# Patient Record
Sex: Female | Born: 1980
Health system: Southern US, Community
[De-identification: ages and names within clinical notes are randomized; demographics above are authoritative.]

## PROBLEM LIST (undated history)

## (undated) ENCOUNTER — Inpatient Hospital Stay (HOSPITAL_COMMUNITY): Payer: Self-pay

## (undated) DIAGNOSIS — O1490 Unspecified pre-eclampsia, unspecified trimester: Secondary | ICD-10-CM

## (undated) DIAGNOSIS — T4145XA Adverse effect of unspecified anesthetic, initial encounter: Secondary | ICD-10-CM

## (undated) DIAGNOSIS — T8859XA Other complications of anesthesia, initial encounter: Secondary | ICD-10-CM

## (undated) DIAGNOSIS — D649 Anemia, unspecified: Secondary | ICD-10-CM

## (undated) DIAGNOSIS — L732 Hidradenitis suppurativa: Secondary | ICD-10-CM

## (undated) HISTORY — PX: WISDOM TOOTH EXTRACTION: SHX21

## (undated) HISTORY — DX: Other complications of anesthesia, initial encounter: T88.59XA

## (undated) HISTORY — DX: Adverse effect of unspecified anesthetic, initial encounter: T41.45XA

---
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
## 2004-07-09 DIAGNOSIS — O1490 Unspecified pre-eclampsia, unspecified trimester: Secondary | ICD-10-CM

## 2005-02-04 ENCOUNTER — Emergency Department (HOSPITAL_COMMUNITY): Admission: EM | Admit: 2005-02-04 | Discharge: 2005-02-04 | Payer: Self-pay | Admitting: Emergency Medicine

## 2005-03-27 ENCOUNTER — Emergency Department (HOSPITAL_COMMUNITY): Admission: EM | Admit: 2005-03-27 | Discharge: 2005-03-27 | Payer: Self-pay | Admitting: Emergency Medicine

## 2005-06-03 ENCOUNTER — Emergency Department (HOSPITAL_COMMUNITY): Admission: EM | Admit: 2005-06-03 | Discharge: 2005-06-04 | Payer: Self-pay | Admitting: Emergency Medicine

## 2005-10-29 ENCOUNTER — Emergency Department (HOSPITAL_COMMUNITY): Admission: EM | Admit: 2005-10-29 | Discharge: 2005-10-29 | Payer: Self-pay | Admitting: Emergency Medicine

## 2005-12-17 ENCOUNTER — Ambulatory Visit (HOSPITAL_COMMUNITY): Admission: RE | Admit: 2005-12-17 | Discharge: 2005-12-17 | Payer: Self-pay | Admitting: Obstetrics

## 2006-01-30 ENCOUNTER — Inpatient Hospital Stay (HOSPITAL_COMMUNITY): Admission: AD | Admit: 2006-01-30 | Discharge: 2006-01-31 | Payer: Self-pay | Admitting: Obstetrics & Gynecology

## 2006-02-21 ENCOUNTER — Inpatient Hospital Stay (HOSPITAL_COMMUNITY): Admission: AD | Admit: 2006-02-21 | Discharge: 2006-02-22 | Payer: Self-pay | Admitting: Obstetrics & Gynecology

## 2006-03-02 ENCOUNTER — Inpatient Hospital Stay (HOSPITAL_COMMUNITY): Admission: AD | Admit: 2006-03-02 | Discharge: 2006-03-02 | Payer: Self-pay | Admitting: Obstetrics & Gynecology

## 2006-03-04 ENCOUNTER — Inpatient Hospital Stay (HOSPITAL_COMMUNITY): Admission: AD | Admit: 2006-03-04 | Discharge: 2006-03-07 | Payer: Self-pay | Admitting: Obstetrics

## 2006-05-17 ENCOUNTER — Emergency Department (HOSPITAL_COMMUNITY): Admission: EM | Admit: 2006-05-17 | Discharge: 2006-05-17 | Payer: Self-pay | Admitting: Emergency Medicine

## 2006-09-03 ENCOUNTER — Emergency Department (HOSPITAL_COMMUNITY): Admission: AD | Admit: 2006-09-03 | Discharge: 2006-09-03 | Payer: Self-pay | Admitting: Family Medicine

## 2006-10-14 ENCOUNTER — Emergency Department (HOSPITAL_COMMUNITY): Admission: EM | Admit: 2006-10-14 | Discharge: 2006-10-14 | Payer: Self-pay | Admitting: Emergency Medicine

## 2006-10-16 ENCOUNTER — Emergency Department (HOSPITAL_COMMUNITY): Admission: EM | Admit: 2006-10-16 | Discharge: 2006-10-16 | Payer: Self-pay | Admitting: Emergency Medicine

## 2015-08-01 ENCOUNTER — Encounter (HOSPITAL_COMMUNITY): Payer: Self-pay | Admitting: *Deleted

## 2015-08-01 ENCOUNTER — Inpatient Hospital Stay (HOSPITAL_COMMUNITY)
Admission: AD | Admit: 2015-08-01 | Discharge: 2015-08-01 | Payer: Medicaid Other | Source: Ambulatory Visit | Attending: Family Medicine | Admitting: Family Medicine

## 2015-08-01 DIAGNOSIS — Z3A39 39 weeks gestation of pregnancy: Secondary | ICD-10-CM | POA: Diagnosis not present

## 2015-08-01 DIAGNOSIS — O471 False labor at or after 37 completed weeks of gestation: Secondary | ICD-10-CM | POA: Insufficient documentation

## 2015-08-01 DIAGNOSIS — O9989 Other specified diseases and conditions complicating pregnancy, childbirth and the puerperium: Secondary | ICD-10-CM

## 2015-08-01 DIAGNOSIS — R109 Unspecified abdominal pain: Secondary | ICD-10-CM | POA: Diagnosis not present

## 2015-08-01 DIAGNOSIS — O26899 Other specified pregnancy related conditions, unspecified trimester: Secondary | ICD-10-CM

## 2015-08-01 DIAGNOSIS — Z87891 Personal history of nicotine dependence: Secondary | ICD-10-CM | POA: Diagnosis not present

## 2015-08-01 DIAGNOSIS — O4290 Premature rupture of membranes, unspecified as to length of time between rupture and onset of labor, unspecified weeks of gestation: Secondary | ICD-10-CM

## 2015-08-01 DIAGNOSIS — R102 Pelvic and perineal pain: Secondary | ICD-10-CM | POA: Diagnosis present

## 2015-08-01 LAB — URINALYSIS, ROUTINE W REFLEX MICROSCOPIC
Bilirubin Urine: NEGATIVE
Glucose, UA: NEGATIVE mg/dL
Hgb urine dipstick: NEGATIVE
Ketones, ur: 15 mg/dL — AB
Nitrite: NEGATIVE
Protein, ur: NEGATIVE mg/dL
Specific Gravity, Urine: 1.02 (ref 1.005–1.030)
pH: 6 (ref 5.0–8.0)

## 2015-08-01 LAB — POCT FERN TEST: POCT FERN TEST: NEGATIVE

## 2015-08-01 LAB — URINE MICROSCOPIC-ADD ON

## 2015-08-01 MED ORDER — ACETAMINOPHEN 325 MG PO TABS: 650.0000 mg | ORAL_TABLET | Freq: Once | ORAL | Status: DC

## 2015-08-01 MED ORDER — OXYCODONE-ACETAMINOPHEN 5-325 MG PO TABS
1.0000 | ORAL_TABLET | Freq: Once | ORAL | Status: AC
  Administered 2015-08-01: 1 via ORAL
  Filled 2015-08-01: qty 1

## 2015-08-01 NOTE — MAU Note (Signed)
Pt arrival via EMS.  Pt is currently in prison.  Pt states she started leaking clear fluid last Wednesday.  Pt states she is having contractions pain in her lower abdomen and feels pressure in her pelvis.  Pt states her pt states her pants were soaked around 1100 and they brought her to the hospital.  Records with pt state that she was having a significant leakage of fluid.  Pt states she is feeling the baby move.  Pt states she is not having any bleeding.

## 2015-08-01 NOTE — MAU Provider Note (Signed)
History     CSN: [id number]  Arrival date and time: 08/01/15 1148   None     Chief Complaint  Patient presents with  . Labor Eval   HPI  Sandra Banks 34 y.o. [id number] approx 39 weeks presents from prison stating that she is having vaginal pressure and contractions. She states she has been leaking fluid some today and last week. Denies vaginal bleeding. Reports positive fetal movement.  History reviewed. No pertinent past medical history.  Past Surgical History  Procedure Laterality Date  . Cesarean section      History reviewed. No pertinent family history.  Social History  Substance Use Topics  . Smoking status: Former Games developer  . Smokeless tobacco: None  . Alcohol Use: No    Allergies: No Known Allergies  Prescriptions prior to admission  Medication Sig Dispense Refill Last Dose  . acetaminophen (TYLENOL) 325 MG tablet Take 650 mg by mouth every 6 (six) hours as needed.   Past Week at Unknown time  . calcium carbonate (TUMS - DOSED IN MG ELEMENTAL CALCIUM) 500 MG chewable tablet Chew 1 tablet by mouth as needed for indigestion or heartburn.   07/31/2015 at Unknown time  . ferrous sulfate 325 (65 FE) MG tablet Take 325 mg by mouth daily with breakfast.   07/31/2015 at Unknown time  . Prenatal Vit-Fe Fumarate-FA (PRENATAL MULTIVITAMIN) TABS tablet Take 1 tablet by mouth daily at 12 noon.   07/31/2015 at Unknown time    Review of Systems  Constitutional: Negative for fever.  Gastrointestinal: Positive for abdominal pain.  Genitourinary:       Vaginal discharge  All other systems reviewed and are negative.  Physical Exam   Blood pressure 115/74, pulse 69, temperature 98.7 F (37.1 C), temperature source Oral, resp. rate 16.  Physical Exam  Nursing note and vitals reviewed. Constitutional: She is oriented to person, place, and time. She appears well-developed and well-nourished. No distress.  HENT:  Head: Normocephalic and atraumatic.  Cardiovascular:  Normal rate.   Respiratory: Effort normal and breath sounds normal. No respiratory distress.  GI: Soft. Bowel sounds are normal. There is no tenderness.  Genitourinary: Vagina normal. Guaiac positive stool.  Musculoskeletal: Normal range of motion.  Neurological: She is alert and oriented to person, place, and time.  Skin: Skin is warm and dry.  Psychiatric: She has a normal mood and affect. Her behavior is normal. Judgment and thought content normal.  Dilation: 1 Effacement (%): Thick Cervical Position: Posterior Exam by:: Illene Bolus, RN Results for orders placed or performed during the hospital encounter of 08/01/15 (from the past 24 hour(s))  Fern Test     Status: Normal   Collection Time: 08/01/15 12:51 PM  Result Value Ref Range   POCT Fern Test Negative = intact amniotic membranes    Results for orders placed or performed during the hospital encounter of 08/01/15 (from the past 24 hour(s))  Fern Test     Status: Normal   Collection Time: 08/01/15 12:51 PM  Result Value Ref Range   POCT Fern Test Negative = intact amniotic membranes    Urinalysis    Component Value Date/Time   COLORURINE YELLOW 08/01/2015 1355   APPEARANCEUR CLEAR 08/01/2015 1355   LABSPEC 1.020 08/01/2015 1355   PHURINE 6.0 08/01/2015 1355   GLUCOSEU NEGATIVE 08/01/2015 1355   HGBUR NEGATIVE 08/01/2015 1355   BILIRUBINUR NEGATIVE 08/01/2015 1355   KETONESUR 15* 08/01/2015 1355   PROTEINUR NEGATIVE 08/01/2015 1355   NITRITE NEGATIVE 08/01/2015  1355   LEUKOCYTESUR TRACE* 08/01/2015 1355     MAU Course  Procedures  MDM R/O labor and rupture; Fern negative; No cervical change noted. Encourage PO Fluids to hydrate. FHR reactive. Occasional contractions Pt will be discharged.  Assessment and Plan  Abdominal Pain in labor [facility]  [facility],Angela Platner Grissett 08/01/2015, 1:48 PM

## 2015-08-01 NOTE — Discharge Instructions (Signed)
Third Trimester of Pregnancy °The third trimester is from week 29 through week 42, months 7 through 9. The third trimester is a time when the fetus is growing rapidly. At the end of the ninth month, the fetus is about 20 inches in length and weighs 6-10 pounds.  °BODY CHANGES °Your body goes through many changes during pregnancy. The changes vary from woman to woman.  °· Your weight will continue to increase. You can expect to gain 25-35 pounds (11-16 kg) by the end of the pregnancy. °· You may begin to get stretch marks on your hips, abdomen, and breasts. °· You may urinate more often because the fetus is moving lower into your pelvis and pressing on your bladder. °· You may develop or continue to have heartburn as a result of your pregnancy. °· You may develop constipation because certain hormones are causing the muscles that push waste through your intestines to slow down. °· You may develop hemorrhoids or swollen, bulging veins (varicose veins). °· You may have pelvic pain because of the weight gain and pregnancy hormones relaxing your joints between the bones in your pelvis. Backaches may result from overexertion of the muscles supporting your posture. °· You may have changes in your hair. These can include thickening of your hair, rapid growth, and changes in texture. Some women also have hair loss during or after pregnancy, or hair that feels dry or thin. Your hair will most likely return to normal after your baby is born. °· Your breasts will continue to grow and be tender. A yellow discharge may leak from your breasts called colostrum. °· Your belly button may stick out. °· You may feel short of breath because of your expanding uterus. °· You may notice the fetus "dropping," or moving lower in your abdomen. °· You may have a bloody mucus discharge. This usually occurs a few days to a week before labor begins. °· Your cervix becomes thin and soft (effaced) near your due date. °WHAT TO EXPECT AT YOUR PRENATAL  EXAMS  °You will have prenatal exams every 2 weeks until week 36. Then, you will have weekly prenatal exams. During a routine prenatal visit: °· You will be weighed to make sure you and the fetus are growing normally. °· Your blood pressure is taken. °· Your abdomen will be measured to track your baby's growth. °· The fetal heartbeat will be listened to. °· Any test results from the previous visit will be discussed. °· You may have a cervical check near your due date to see if you have effaced. °At around 36 weeks, your caregiver will check your cervix. At the same time, your caregiver will also perform a test on the secretions of the vaginal tissue. This test is to determine if a type of bacteria, Group B streptococcus, is present. Your caregiver will explain this further. °Your caregiver may ask you: °· What your birth plan is. °· How you are feeling. °· If you are feeling the baby move. °· If you have had any abnormal symptoms, such as leaking fluid, bleeding, severe headaches, or abdominal cramping. °· If you are using any tobacco products, including cigarettes, chewing tobacco, and electronic cigarettes. °· If you have any questions. °Other tests or screenings that may be performed during your third trimester include: °· Blood tests that check for low iron levels (anemia). °· Fetal testing to check the health, activity level, and growth of the fetus. Testing is done if you have certain medical conditions or if   there are problems during the pregnancy. °· HIV (human immunodeficiency virus) testing. If you are at high risk, you may be screened for HIV during your third trimester of pregnancy. °FALSE LABOR °You may feel small, irregular contractions that eventually go away. These are called Braxton Hicks contractions, or false labor. Contractions may last for hours, days, or even weeks before true labor sets in. If contractions come at regular intervals, intensify, or become painful, it is best to be seen by your  caregiver.  °SIGNS OF LABOR  °· Menstrual-like cramps. °· Contractions that are 5 minutes apart or less. °· Contractions that start on the top of the uterus and spread down to the lower abdomen and back. °· A sense of increased pelvic pressure or back pain. °· A watery or bloody mucus discharge that comes from the vagina. °If you have any of these signs before the 37th week of pregnancy, call your caregiver right away. You need to go to the hospital to get checked immediately. °HOME CARE INSTRUCTIONS  °· Avoid all smoking, herbs, alcohol, and unprescribed drugs. These chemicals affect the formation and growth of the baby. °· Do not use any tobacco products, including cigarettes, chewing tobacco, and electronic cigarettes. If you need help quitting, ask your health care provider. You may receive counseling support and other resources to help you quit. °· Follow your caregiver's instructions regarding medicine use. There are medicines that are either safe or unsafe to take during pregnancy. °· Exercise only as directed by your caregiver. Experiencing uterine cramps is a good sign to stop exercising. °· Continue to eat regular, healthy meals. °· Wear a good support bra for breast tenderness. °· Do not use hot tubs, steam rooms, or saunas. °· Wear your seat belt at all times when driving. °· Avoid raw meat, uncooked cheese, cat litter boxes, and soil used by cats. These carry germs that can cause birth defects in the baby. °· Take your prenatal vitamins. °· Take 1500-2000 mg of calcium daily starting at the 20th week of pregnancy until you deliver your baby. °· Try taking a stool softener (if your caregiver approves) if you develop constipation. Eat more high-fiber foods, such as fresh vegetables or fruit and whole grains. Drink plenty of fluids to keep your urine clear or pale yellow. °· Take warm sitz baths to soothe any pain or discomfort caused by hemorrhoids. Use hemorrhoid cream if your caregiver approves. °· If  you develop varicose veins, wear support hose. Elevate your feet for 15 minutes, 3-4 times a day. Limit salt in your diet. °· Avoid heavy lifting, wear low heal shoes, and practice good posture. °· Rest a lot with your legs elevated if you have leg cramps or low back pain. °· Visit your dentist if you have not gone during your pregnancy. Use a soft toothbrush to brush your teeth and be gentle when you floss. °· A sexual relationship may be continued unless your caregiver directs you otherwise. °· Do not travel far distances unless it is absolutely necessary and only with the approval of your caregiver. °· Take prenatal classes to understand, practice, and ask questions about the labor and delivery. °· Make a trial run to the hospital. °· Pack your hospital bag. °· Prepare the baby's nursery. °· Continue to go to all your prenatal visits as directed by your caregiver. °SEEK MEDICAL CARE IF: °· You are unsure if you are in labor or if your water has broken. °· You have dizziness. °· You have   mild pelvic cramps, pelvic pressure, or nagging pain in your abdominal area.  You have persistent nausea, vomiting, or diarrhea.  You have a bad smelling vaginal discharge.  You have pain with urination. SEEK IMMEDIATE MEDICAL CARE IF:   You have a fever.  You are leaking fluid from your vagina.  You have spotting or bleeding from your vagina.  You have severe abdominal cramping or pain.  You have rapid weight loss or gain.  You have shortness of breath with chest pain.  You notice sudden or extreme swelling of your face, hands, ankles, feet, or legs.  You have not felt your baby move in over an hour.  You have severe headaches that do not go away with medicine.  You have vision changes.   This information is not intended to replace advice given to you by your health care provider. Make sure you discuss any questions you have with your health care provider.   Document Released: 08/14/2001 Document  Revised: 09/10/2014 Document Reviewed: 10/21/2012 Elsevier Interactive Patient Education 2016 [employer] of the uterus can occur throughout pregnancy. Contractions are not always a sign that you are in labor.  WHAT ARE BRAXTON HICKS CONTRACTIONS?  Contractions that occur before labor are called Braxton Hicks contractions, or false labor. Toward the end of pregnancy (32-34 weeks), these contractions can develop more often and may become more forceful. This is not true labor because these contractions do not result in opening (dilatation) and thinning of the cervix. They are sometimes difficult to tell apart from true labor because these contractions can be forceful and people have different pain tolerances. You should not feel embarrassed if you go to the hospital with false labor. Sometimes, the only way to tell if you are in true labor is for your health care provider to look for changes in the cervix. If there are no prenatal problems or other health problems associated with the pregnancy, it is completely safe to be sent home with false labor and await the onset of true labor. HOW CAN YOU TELL THE DIFFERENCE BETWEEN TRUE AND FALSE LABOR? False Labor  The contractions of false labor are usually shorter and not as hard as those of true labor.   The contractions are usually irregular.   The contractions are often felt in the front of the lower abdomen and in the groin.   The contractions may go away when you walk around or change positions while lying down.   The contractions get weaker and are shorter lasting as time goes on.   The contractions do not usually become progressively stronger, regular, and closer together as with true labor.  True Labor  Contractions in true labor last 30-70 seconds, become very regular, usually become more intense, and increase in frequency.   The contractions do not go away with walking.   The discomfort  is usually felt in the top of the uterus and spreads to the lower abdomen and low back.   True labor can be determined by your health care provider with an exam. This will show that the cervix is dilating and getting thinner.  WHAT TO REMEMBER  Keep up with your usual exercises and follow other instructions given by your health care provider.   Take medicines as directed by your health care provider.   Keep your regular prenatal appointments.   Eat and drink lightly if you think you are going into labor.   If Braxton Hicks contractions are making  you uncomfortable:   °· Change your position from lying down or resting to walking, or from walking to resting.   °· Sit and rest in a tub of warm water.   °· Drink 2-3 glasses of water. Dehydration may cause these contractions.   °· Do slow and deep breathing several times an hour.   °WHEN SHOULD I SEEK IMMEDIATE MEDICAL CARE? °Seek immediate medical care if: °· Your contractions become stronger, more regular, and closer together.   °· You have fluid leaking or gushing from your vagina.   °· You have a fever.   °· You pass blood-tinged mucus.   °· You have vaginal bleeding.   °· You have continuous abdominal pain.   °· You have low back pain that you never had before.   °· You feel your baby's head pushing down and causing pelvic pressure.   °· Your baby is not moving as much as it used to.   °  °This information is not intended to replace advice given to you by your health care provider. Make sure you discuss any questions you have with your health care provider. °  °Document Released: 08/20/2005 Document Revised: 08/25/2013 Document Reviewed: 06/01/2013 °Elsevier Interactive Patient Education ©2016 Elsevier Inc[employer] ° °

## 2015-08-01 NOTE — Progress Notes (Signed)
Provider notified of pt arrival to MAU.  Provider notified of leaking of fluid and vaginal exam showing the pt is closed.  Provider notified that pt does not have a specific due date.  Provider stated to fern and call back.

## 2015-08-01 NOTE — Progress Notes (Signed)
Provider notified of minimal variability.  States she will talk to attending and come see pt.

## 2015-08-04 DIAGNOSIS — O99013 Anemia complicating pregnancy, third trimester: Secondary | ICD-10-CM | POA: Diagnosis present

## 2016-06-05 ENCOUNTER — Encounter (HOSPITAL_COMMUNITY): Payer: Self-pay

## 2016-09-21 ENCOUNTER — Ambulatory Visit (HOSPITAL_COMMUNITY)
Admission: EM | Admit: 2016-09-21 | Discharge: 2016-09-21 | Disposition: A | Discharge status: Home / Self Care | Payer: Medicaid Other | Attending: Family Medicine | Admitting: Family Medicine

## 2016-09-21 ENCOUNTER — Encounter (HOSPITAL_COMMUNITY): Payer: Self-pay | Admitting: *Deleted

## 2016-09-21 DIAGNOSIS — R102 Pelvic and perineal pain: Secondary | ICD-10-CM

## 2016-09-21 DIAGNOSIS — Z9889 Other specified postprocedural states: Secondary | ICD-10-CM | POA: Insufficient documentation

## 2016-09-21 DIAGNOSIS — Z79899 Other long term (current) drug therapy: Secondary | ICD-10-CM | POA: Diagnosis not present

## 2016-09-21 DIAGNOSIS — Z87891 Personal history of nicotine dependence: Secondary | ICD-10-CM | POA: Diagnosis not present

## 2016-09-21 DIAGNOSIS — Z113 Encounter for screening for infections with a predominantly sexual mode of transmission: Secondary | ICD-10-CM | POA: Diagnosis not present

## 2016-09-21 DIAGNOSIS — L292 Pruritus vulvae: Secondary | ICD-10-CM | POA: Diagnosis not present

## 2016-09-21 MED ORDER — CEFTRIAXONE SODIUM 250 MG IJ SOLR
250.0000 mg | Freq: Once | INTRAMUSCULAR | Status: AC
  Administered 2016-09-21: 250 mg via INTRAMUSCULAR

## 2016-09-21 MED ORDER — AZITHROMYCIN 250 MG PO TABS
ORAL_TABLET | ORAL | Status: AC
  Filled 2016-09-21: qty 4

## 2016-09-21 MED ORDER — CEFTRIAXONE SODIUM 250 MG IJ SOLR
INTRAMUSCULAR | Status: AC
  Filled 2016-09-21: qty 250

## 2016-09-21 MED ORDER — LIDOCAINE HCL (PF) 1 % IJ SOLN
INTRAMUSCULAR | Status: AC
  Filled 2016-09-21: qty 2

## 2016-09-21 MED ORDER — AZITHROMYCIN 250 MG PO TABS
1000.0000 mg | ORAL_TABLET | Freq: Once | ORAL | Status: AC
  Administered 2016-09-21: 1000 mg via ORAL

## 2016-09-21 NOTE — ED Notes (Signed)
Call back number verified and updated in EPIC... Adv pt to not have SI until lab results comeback neg.... Also adv pt lab results will be on MyChart; instructions given .... Pt verb understanding.   

## 2016-09-21 NOTE — ED Provider Notes (Signed)
CSN: [id number]     Arrival date & time 09/21/16  1154 History   First MD Initiated Contact with Patient 09/21/16 1411     Chief Complaint  Patient presents with  . Vaginal Itching   (Consider location/radiation/quality/duration/timing/severity/associated sxs/prior Treatment) 36 year old female presents to clinic with chief complaint of vaginal burning and pain, She reports she used nare to remove hair on her legs, and did not get any on her vagina, however the burning started on the same day. She denies discharge, odor, abdominal pain, flank pain, fever, or chills, she does experience pain with intercourse. She is with one partner, has not used protection. She wishes to be tested for STDs and HIV while she is here.   The history is provided by the patient.    History reviewed. No pertinent past medical history. Past Surgical History:  Procedure Laterality Date  . CESAREAN SECTION     History reviewed. No pertinent family history. Social History  Substance Use Topics  . Smoking status: Former Games developer  . Smokeless tobacco: Never Used  . Alcohol use No   OB History    Gravida Para Term Preterm AB Living   6 4   4   5    SAB TAB Ectopic Multiple Live Births                 Review of Systems  Reason unable to perform ROS: as covered in HPI.  All other systems reviewed and are negative.   Allergies  Patient has no known allergies.  Home Medications   Prior to Admission medications   Medication Sig Start Date End Date Taking? Authorizing Provider  acetaminophen (TYLENOL) 325 MG tablet Take 650 mg by mouth every 6 (six) hours as needed.    Historical Provider, MD  calcium carbonate (TUMS - DOSED IN MG ELEMENTAL CALCIUM) 500 MG chewable tablet Chew 1 tablet by mouth as needed for indigestion or heartburn.    Historical Provider, MD  ferrous sulfate 325 (65 FE) MG tablet Take 325 mg by mouth daily with breakfast.    Historical Provider, MD  Prenatal Vit-Fe Fumarate-FA (PRENATAL  MULTIVITAMIN) TABS tablet Take 1 tablet by mouth daily at 12 noon.    Historical Provider, MD   Meds Ordered and Administered this Visit   Medications  azithromycin (ZITHROMAX) tablet 1,000 mg (not administered)  cefTRIAXone (ROCEPHIN) injection 250 mg (not administered)    LMP 08/05/2016  No data found.   Physical Exam  Constitutional: She is oriented to person, place, and time. She appears well-developed and well-nourished.  Cardiovascular: Normal rate and regular rhythm.   Abdominal: Soft. Bowel sounds are normal. She exhibits no distension. There is no tenderness. There is no guarding.  Genitourinary: Uterus normal. Pelvic exam was performed with patient prone. There is no rash, tenderness, lesion or injury on the right labia. There is no rash, tenderness, lesion or injury on the left labia. Cervix exhibits friability. Cervix exhibits no motion tenderness and no discharge. Right adnexum displays no tenderness. Left adnexum displays no tenderness. There is tenderness in the vagina. No erythema or bleeding in the vagina. No foreign body in the vagina. No signs of injury around the vagina. No vaginal discharge found.    Neurological: She is alert and oriented to person, place, and time.  Skin: Skin is warm and dry. Capillary refill takes less than 2 seconds.  Psychiatric: She has a normal mood and affect.  Nursing note and vitals reviewed.   Urgent Care  Course     Procedures (including critical care time)  Labs Review Labs Reviewed  HIV ANTIBODY (ROUTINE TESTING)  RPR  CERVICOVAGINAL ANCILLARY ONLY    Imaging Review No results found.   Visual Acuity Review  Right Eye Distance:   Left Eye Distance:   Bilateral Distance:    Right Eye Near:   Left Eye Near:    Bilateral Near:         MDM   1. Screening examination for STD (sexually transmitted disease)   You are being tested today for gonorrhea, chlamydia, trichomoniasis, yeast, syphilis, and HIV. You will be  notified of your results in 24 to 72 hours. You are being treated today based on your symptoms and exam with an injection of ceftriaxone and  azithromycin. Should additional medications be needed based on your results you will be notified and instructions will be given. Should your symptoms persist or fail to improve, I recommend you follow up with a gynecologist for further evaluation and management.      Dorena BodoLawrence Auri Jahnke, NP 09/21/16 1438

## 2016-09-21 NOTE — ED Triage Notes (Signed)
Patient reports that she used nair a couple days ago and since has had vaginal burning and discomfort. Patient is sexually active. No discharge. States it is very uncomfortable to touch.

## 2016-09-21 NOTE — Discharge Instructions (Signed)
You are being tested today for gonorrhea, chlamydia, trichomoniasis, yeast, syphilis, and HIV. You will be notified of your results in 24 to 72 hours. You are being treated today based on your symptoms and exam with an injection of ceftriaxone and  azithromycin. Should additional medications be needed based on your results you will be notified and instructions will be given. Should your symptoms persist or fail to improve, I recommend you follow up with a gynecologist for further evaluation and management.

## 2016-09-22 LAB — HIV ANTIBODY (ROUTINE TESTING W REFLEX): HIV Screen 4th Generation wRfx: NONREACTIVE

## 2016-09-22 LAB — RPR: RPR Ser Ql: NONREACTIVE

## 2016-09-24 LAB — CERVICOVAGINAL ANCILLARY ONLY
Chlamydia: NEGATIVE
NEISSERIA GONORRHEA: NEGATIVE
Wet Prep (BD Affirm): NEGATIVE

## 2016-10-21 ENCOUNTER — Emergency Department (HOSPITAL_COMMUNITY)
Admission: EM | Admit: 2016-10-21 | Discharge: 2016-10-21 | Disposition: A | Discharge status: Home / Self Care | Payer: Medicaid Other | Attending: Emergency Medicine | Admitting: Emergency Medicine

## 2016-10-21 ENCOUNTER — Encounter (HOSPITAL_COMMUNITY): Payer: Self-pay | Admitting: *Deleted

## 2016-10-21 ENCOUNTER — Emergency Department (HOSPITAL_COMMUNITY): Payer: Medicaid Other

## 2016-10-21 DIAGNOSIS — Y999 Unspecified external cause status: Secondary | ICD-10-CM | POA: Insufficient documentation

## 2016-10-21 DIAGNOSIS — M25511 Pain in right shoulder: Secondary | ICD-10-CM | POA: Insufficient documentation

## 2016-10-21 DIAGNOSIS — Y92524 Gas station as the place of occurrence of the external cause: Secondary | ICD-10-CM | POA: Diagnosis not present

## 2016-10-21 DIAGNOSIS — Y9389 Activity, other specified: Secondary | ICD-10-CM | POA: Insufficient documentation

## 2016-10-21 DIAGNOSIS — Z87891 Personal history of nicotine dependence: Secondary | ICD-10-CM | POA: Insufficient documentation

## 2016-10-21 MED ORDER — IBUPROFEN 800 MG PO TABS
800.0000 mg | ORAL_TABLET | Freq: Once | ORAL | Status: AC
  Administered 2016-10-21: 800 mg via ORAL
  Filled 2016-10-21: qty 1

## 2016-10-21 MED ORDER — OXYCODONE-ACETAMINOPHEN 5-325 MG PO TABS
2.0000 | ORAL_TABLET | Freq: Once | ORAL | Status: AC
  Administered 2016-10-21: 2 via ORAL
  Filled 2016-10-21: qty 2
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
## 2016-10-21 NOTE — ED Notes (Signed)
Declined W/C at D/C and was escorted to lobby by RN. 

## 2016-10-29 ENCOUNTER — Emergency Department (HOSPITAL_COMMUNITY)
Admission: EM | Admit: 2016-10-29 | Discharge: 2016-10-29 | Disposition: A | Discharge status: Home / Self Care | Payer: Medicaid Other | Attending: Emergency Medicine | Admitting: Emergency Medicine

## 2016-10-29 ENCOUNTER — Encounter (HOSPITAL_COMMUNITY): Payer: Self-pay

## 2016-10-29 DIAGNOSIS — Y999 Unspecified external cause status: Secondary | ICD-10-CM | POA: Diagnosis not present

## 2016-10-29 DIAGNOSIS — M25511 Pain in right shoulder: Secondary | ICD-10-CM

## 2016-10-29 DIAGNOSIS — Y92410 Unspecified street and highway as the place of occurrence of the external cause: Secondary | ICD-10-CM | POA: Diagnosis not present

## 2016-10-29 DIAGNOSIS — Z87891 Personal history of nicotine dependence: Secondary | ICD-10-CM | POA: Insufficient documentation

## 2016-10-29 DIAGNOSIS — Y939 Activity, unspecified: Secondary | ICD-10-CM | POA: Diagnosis not present

## 2016-10-29 MED ORDER — NAPROXEN 500 MG PO TABS: 500.0000 mg | ORAL_TABLET | Freq: Two times a day (BID) | ORAL | 0 refills | Status: DC

## 2016-10-29 MED ORDER — NAPROXEN 500 MG PO TABS
500.0000 mg | ORAL_TABLET | Freq: Once | ORAL | Status: AC
  Administered 2016-10-29: 500 mg via ORAL
  Filled 2016-10-29: qty 1

## 2016-10-29 MED ORDER — TRAMADOL HCL 50 MG PO TABS: 50.0000 mg | ORAL_TABLET | Freq: Four times a day (QID) | ORAL | 0 refills | Status: DC | PRN
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
## 2016-10-29 NOTE — Discharge Instructions (Signed)
Take your medications as prescribed. I also recommend continuing to apply ice for 15-20 minutes 3-4 times daily. You may continue wearing your arm sling throughout the day for comfort however I recommend taking the sling off at night or when you are at home resting. Continue doing your arm stretches/exercises multiple times daily to help prevent frozen shoulder. I recommend following up with the orthopedic office listed below within the next week for reevaluation and further management if he continued having shoulder pain.

## 2016-12-17 ENCOUNTER — Emergency Department (HOSPITAL_COMMUNITY)
Admission: EM | Admit: 2016-12-17 | Discharge: 2016-12-17 | Disposition: A | Discharge status: Home / Self Care | Payer: Medicaid Other | Attending: Emergency Medicine | Admitting: Emergency Medicine

## 2016-12-17 ENCOUNTER — Encounter (HOSPITAL_COMMUNITY): Payer: Self-pay

## 2016-12-17 DIAGNOSIS — L02212 Cutaneous abscess of back [any part, except buttock]: Secondary | ICD-10-CM | POA: Diagnosis present

## 2016-12-17 DIAGNOSIS — Z79899 Other long term (current) drug therapy: Secondary | ICD-10-CM | POA: Diagnosis not present

## 2016-12-17 DIAGNOSIS — Z87891 Personal history of nicotine dependence: Secondary | ICD-10-CM | POA: Diagnosis not present

## 2016-12-17 DIAGNOSIS — L0291 Cutaneous abscess, unspecified: Secondary | ICD-10-CM

## 2016-12-17 HISTORY — DX: Hidradenitis suppurativa: L73.2

## 2016-12-17 MED ORDER — SULFAMETHOXAZOLE-TRIMETHOPRIM 800-160 MG PO TABS: 1.0000 | ORAL_TABLET | Freq: Two times a day (BID) | ORAL | 0 refills | Status: AC

## 2016-12-17 MED ORDER — OXYCODONE-ACETAMINOPHEN 5-325 MG PO TABS
1.0000 | ORAL_TABLET | Freq: Once | ORAL | Status: AC
  Administered 2016-12-17: 1 via ORAL
  Filled 2016-12-17: qty 1

## 2016-12-17 MED ORDER — LIDOCAINE-EPINEPHRINE (PF) 2 %-1:200000 IJ SOLN
10.0000 mL | Freq: Once | INTRAMUSCULAR | Status: AC
  Administered 2016-12-17: 10 mL
  Filled 2016-12-17: qty 20

## 2016-12-17 MED ORDER — OXYCODONE-ACETAMINOPHEN 5-325 MG PO TABS: 1.0000 | ORAL_TABLET | Freq: Four times a day (QID) | ORAL | 0 refills | Status: DC | PRN

## 2016-12-17 NOTE — ED Provider Notes (Signed)
[facility]-EMERGENCY DEPT Provider Note   CSN: [id number] Arrival date & time: 12/17/16  1217   By signing my name below, I, Soijett Blue, attest that this documentation has been prepared under the direction and in the presence of Lyndel Safe, PA-C Electronically Signed: Soijett Blue, ED Scribe. 12/17/16. 12:58 PM.  History   Chief Complaint Chief Complaint  Patient presents with  . Abscess    HPI Sandra Banks is a 36 y.o. female with a PMHx of hidradenitis suppurative, who presents to the Emergency Department complaining of abscess to lower back onset 3 days ago. She notes that she noticed pain to the affected area prior to the onset of her symptoms. Pt reports that she has had multiple abscesses to her bilateral axillas with surgical I&D and removal of glands. Pt notes associated redness to the affected area. Pt has not tried any medications for the relief of her symptoms. She denies numbness, weakness, fever, chills, rhinorrhea, nasal congestion, nausea, vomiting, abdominal pain, and any other symptoms. Denies PMHx of DM, IV drug use, recent injections, recent abx use, or allergies to antibiotics.      The history is provided by the patient. No language interpreter was used.    Past Medical History:  Diagnosis Date  . Hidradenitis suppurativa of left axilla   . Hidradenitis suppurativa of right axilla     There are no active problems to display for this patient.   Past Surgical History:  Procedure Laterality Date  . CESAREAN SECTION      OB History    Gravida Para Term Preterm AB Living   SAB TAB Ectopic Multiple Live Births                   Home Medications    Prior to Admission medications   Medication Sig Start Date End Date Taking? Authorizing Provider  acetaminophen (TYLENOL) 325 MG tablet Take 650 mg by mouth every 6 (six) hours as needed.    Historical Provider, MD  calcium carbonate (TUMS - DOSED IN MG ELEMENTAL CALCIUM) 500 MG  chewable tablet Chew 1 tablet by mouth as needed for indigestion or heartburn.    Historical Provider, MD  ferrous sulfate 325 (65 FE) MG tablet Take 325 mg by mouth daily with breakfast.    Historical Provider, MD  naproxen (NAPROSYN) 500 MG tablet Take 1 tablet (500 mg total) by mouth 2 (two) times daily. 10/29/16   Barrett Henle, PA-C  oxyCODONE-acetaminophen (PERCOCET) 5-325 MG tablet Take 1 tablet by mouth every 6 (six) hours as needed. 12/17/16   Bethann Berkshire, MD  Prenatal Vit-Fe Fumarate-FA (PRENATAL MULTIVITAMIN) TABS tablet Take 1 tablet by mouth daily at 12 noon.    Historical Provider, MD  sulfamethoxazole-trimethoprim (BACTRIM DS,SEPTRA DS) 800-160 MG tablet Take 1 tablet by mouth 2 (two) times daily. 12/17/16 12/24/16  Bethann Berkshire, MD  traMADol (ULTRAM) 50 MG tablet Take 1 tablet (50 mg total) by mouth every 6 (six) hours as needed. 10/29/16   Barrett Henle, PA-C    Family History History reviewed. No pertinent family history.  Social History Social History  Substance Use Topics  . Smoking status: Former Games developer  . Smokeless tobacco: Never Used  . Alcohol use No     Allergies   Patient has no known allergies.   Review of Systems Review of Systems  Constitutional: Negative for appetite change, chills, diaphoresis, fatigue and fever.  HENT: Negative for  congestion and rhinorrhea.   Eyes: Negative for pain.  Respiratory: Negative for shortness of breath.   Cardiovascular: Negative for chest pain.  Gastrointestinal: Negative for abdominal pain.  Genitourinary: Negative for difficulty urinating and flank pain.  Musculoskeletal: Negative for arthralgias.  Skin: Positive for color change. Negative for wound.       +Abscess to lower back without drainage  Neurological: Negative for weakness and numbness.    Physical Exam Updated Vital Signs BP 136/87 (BP Location: Right Arm)   Pulse 79   Temp 97.9 F (36.6 C) (Oral)   Resp 15   Ht  (1.778 m)    Wt 121.1 kg   LMP 12/02/2016   SpO2 97%   BMI 38.31 kg/m   Physical Exam  Constitutional: She appears well-developed and well-nourished. No distress.  HENT:  Head: Normocephalic and atraumatic.  Eyes: EOM are normal.  Neck: Neck supple.  Cardiovascular: Normal rate.   Pulmonary/Chest: Effort normal. No respiratory distress.  Abdominal: She exhibits no distension.  Musculoskeletal: Normal range of motion.  Skin: Skin is warm and dry. There is erythema.  3 cm area of redness and swelling over midline with what appears to be a blackhead to the middle of it.  Psychiatric: She has a normal mood and affect. Her behavior is normal.  Nursing note and vitals reviewed.    ED Treatments / Results  DIAGNOSTIC STUDIES: Oxygen Saturation is 97% on RA, nl by my interpretation.    COORDINATION OF CARE: 12:57 PM Discussed treatment plan with pt at bedside which includes consult with attending, I&D, and pt agreed to plan.  Procedures .Marland KitchenIncision and Drainage Date/Time: 12/17/2016 2:03 PM Performed by: Cristina Gong Authorized by: Cristina Gong   Consent:    Consent obtained:  Verbal   Consent given by:  Patient   Risks discussed:  Pain and infection Location:    Type:  Abscess   Size:  3 cm   Location:  Trunk   Trunk location:  Back Pre-procedure details:    Skin preparation:  Betadine Anesthesia (see MAR for exact dosages):    Anesthesia method:  Local infiltration   Local anesthetic:  Lidocaine 2% WITH epi (7 ml used) Procedure type:    Complexity:  Simple Procedure details:    Needle aspiration: no     Incision types:  Stab incision   Incision depth:  Dermal   Scalpel blade:  11   Wound management:  Probed and deloculated and irrigated with saline   Drainage:  Purulent   Drainage amount:  Scant   Wound treatment:  Wound left open   Packing materials:  1/4 in iodoform gauze   Amount 1/4" iodoform:  5 cm Post-procedure details:    Patient tolerance of  procedure:  Tolerated with difficulty       (including critical care time)  Medications Ordered in ED Medications  lidocaine-EPINEPHrine (XYLOCAINE W/EPI) 2 %-1:200000 (PF) injection 10 mL (10 mLs Infiltration Given 12/17/16 1332)  oxyCODONE-acetaminophen (PERCOCET/ROXICET) 5-325 MG per tablet 1 tablet (1 tablet Oral Given 12/17/16 1332)     Initial Impression / Assessment and Plan / ED Course  I have reviewed the triage vital signs and the nursing notes.  Patient with skin abscess. Incision and drainage performed in the ED today.  Abscess was packed, Wound recheck in 2 days. Supportive care and return precautions discussed.  Pt sent home with bactrim and percocet prescriptions. Bactrim was given as abscess is midline back over spine.  The  patient appears reasonably screened and/or stabilized for discharge and I doubt any other emergent medical condition requiring further screening, evaluation, or treatment in the ED prior to discharge.   Final Clinical Impressions(s) / ED Diagnoses   Final diagnoses:  Abscess    New Prescriptions Discharge Medication List as of 12/17/2016  3:03 PM    START taking these medications   Details  oxyCODONE-acetaminophen (PERCOCET) 5-325 MG tablet Take 1 tablet by mouth every 6 (six) hours as needed., Starting Mon 12/17/2016, Print    sulfamethoxazole-trimethoprim (BACTRIM DS,SEPTRA DS) 800-160 MG tablet Take 1 tablet by mouth 2 (two) times daily., Starting Mon 12/17/2016, Until Mon 12/24/2016, Print       I personally performed the services described in this documentation, which was scribed in my presence. The recorded information has been reviewed and is accurate.    Cristina Gong, PA-C 12/19/16 1258    Bethann Berkshire, MD 12/19/16 [phone]

## 2016-12-17 NOTE — Discharge Instructions (Signed)
Please have a wound re-check in the next 48 hours.  You can come here or go to an urgent care.  Please seek care sooner if you worsen.

## 2016-12-17 NOTE — ED Triage Notes (Signed)
PT C/O PAIN AND REDNESS TO THE LOWER BACK SINCE Friday. PT STS SHE HAS A HX OF RECURRENT CYST, BUT NEVER TO THIS AREA. PT DENIES FEVER, DRAINAGE, OR CHILLS.

## 2016-12-19 ENCOUNTER — Encounter (HOSPITAL_COMMUNITY): Payer: Self-pay | Admitting: Emergency Medicine

## 2016-12-19 ENCOUNTER — Emergency Department (HOSPITAL_COMMUNITY)
Admission: EM | Admit: 2016-12-19 | Discharge: 2016-12-19 | Disposition: A | Discharge status: Home / Self Care | Payer: Medicaid Other | Attending: Dermatology | Admitting: Dermatology

## 2016-12-19 DIAGNOSIS — Z5321 Procedure and treatment not carried out due to patient leaving prior to being seen by health care provider: Secondary | ICD-10-CM | POA: Diagnosis present

## 2016-12-19 NOTE — ED Triage Notes (Signed)
Patient reports that she was supposed to come back to get dressing from I&D on back removed and wound rechecked.  Patient still having pain

## 2016-12-19 NOTE — ED Notes (Signed)
Called x 2 in lobby w/o answer.

## 2016-12-25 ENCOUNTER — Other Ambulatory Visit: Payer: Self-pay | Admitting: Surgery

## 2017-01-29 ENCOUNTER — Emergency Department (HOSPITAL_COMMUNITY)
Admission: EM | Admit: 2017-01-29 | Discharge: 2017-01-29 | Disposition: A | Discharge status: Home / Self Care | Payer: Medicaid Other | Attending: Emergency Medicine | Admitting: Emergency Medicine

## 2017-01-29 ENCOUNTER — Encounter (HOSPITAL_COMMUNITY): Payer: Self-pay | Admitting: Emergency Medicine

## 2017-01-29 DIAGNOSIS — Z79899 Other long term (current) drug therapy: Secondary | ICD-10-CM | POA: Insufficient documentation

## 2017-01-29 DIAGNOSIS — Z87891 Personal history of nicotine dependence: Secondary | ICD-10-CM | POA: Diagnosis not present

## 2017-01-29 DIAGNOSIS — N611 Abscess of the breast and nipple: Secondary | ICD-10-CM | POA: Insufficient documentation

## 2017-01-29 MED ORDER — OXYCODONE-ACETAMINOPHEN 5-325 MG PO TABS: 2.0000 | ORAL_TABLET | Freq: Four times a day (QID) | ORAL | 0 refills | Status: DC | PRN

## 2017-01-29 MED ORDER — FLUCONAZOLE 200 MG PO TABS: 200.0000 mg | ORAL_TABLET | Freq: Every day | ORAL | 0 refills | Status: AC

## 2017-01-29 MED ORDER — SULFAMETHOXAZOLE-TRIMETHOPRIM 800-160 MG PO TABS: 1.0000 | ORAL_TABLET | Freq: Two times a day (BID) | ORAL | 0 refills | Status: AC

## 2017-01-29 MED ORDER — OXYCODONE-ACETAMINOPHEN 5-325 MG PO TABS
1.0000 | ORAL_TABLET | Freq: Once | ORAL | Status: AC
  Administered 2017-01-29: 1 via ORAL
  Filled 2017-01-29: qty 1

## 2017-01-29 NOTE — Discharge Instructions (Signed)
Take Bactrim twice daily for 10 days. Take Diflucan and Percocet as directed. Call [facility] as soon as possible for further evaluation using information listed below. Return to ED for worsening pain, increased drainage, fever, additional abscesses, bleeding.

## 2017-01-29 NOTE — ED Triage Notes (Signed)
Patient c/o new right breast abscess that is very painful and denies any drainage over the past 2 days.

## 2017-01-29 NOTE — ED Provider Notes (Signed)
[facility]-EMERGENCY DEPT Provider Note   CSN: [id number] Arrival date & time: 01/29/17  1645     History   Chief Complaint Chief Complaint  Patient presents with  . breast abscess    right    HPI Sandra Banks is a 36 y.o. female.  HPI  Patient with a past medical history of recurrent abscesses, presents with complaints of right-sided breast abscess. She states that symptoms began approximately 2 days ago and have worsened. Patient is complaining of severe pain in the area. She has had abscess on the left breast in the past which has never been drained. She states that this is more painful and inflamed than her previous abscesses. She is scheduled for surgery in approximately 5 days for drainage of the left breast abscesses. She had surgery for abscesses of her under arms in the past. She has recently gotten the abscess on her back drained about a month ago. Denies fever, drainage, bleeding, trauma, breast-feeding, or other complaints at this time.    Past Medical History:  Diagnosis Date  . Hidradenitis suppurativa of left axilla   . Hidradenitis suppurativa of right axilla     There are no active problems to display for this patient.   Past Surgical History:  Procedure Laterality Date  . CESAREAN SECTION      OB History    Gravida Para Term Preterm AB Living   6 4   4   5    SAB TAB Ectopic Multiple Live Births                   Home Medications    Prior to Admission medications   Medication Sig Start Date End Date Taking? Authorizing Provider  acetaminophen (TYLENOL) 325 MG tablet Take 650 mg by mouth every 6 (six) hours as needed.   Yes [provider]  calcium carbonate (TUMS - DOSED IN MG ELEMENTAL CALCIUM) 500 MG chewable tablet Chew 1 tablet by mouth as needed for indigestion or heartburn.   Yes [provider]  fluconazole (DIFLUCAN) 200 MG tablet Take 1 tablet (200 mg total) by mouth daily. 01/29/17 02/05/17  Efton Thomley, PA-C  naproxen  (NAPROSYN) 500 MG tablet Take 1 tablet (500 mg total) by mouth 2 (two) times daily. Patient not taking: Reported on 01/29/2017 10/29/16   Barrett Henle, PA-C  oxyCODONE-acetaminophen (PERCOCET/ROXICET) 5-325 MG tablet Take 2 tablets by mouth every 6 (six) hours as needed for severe pain. 01/29/17   Dontrez Pettis, PA-C  sulfamethoxazole-trimethoprim (BACTRIM DS,SEPTRA DS) 800-160 MG tablet Take 1 tablet by mouth 2 (two) times daily. 01/29/17 02/08/17  Geoffrey Hynes, PA-C  traMADol (ULTRAM) 50 MG tablet Take 1 tablet (50 mg total) by mouth every 6 (six) hours as needed. Patient not taking: Reported on 01/29/2017 10/29/16   Barrett Henle, PA-C    Family History No family history on file.  Social History Social History  Substance Use Topics  . Smoking status: Former Games developer  . Smokeless tobacco: Never Used  . Alcohol use No     Allergies   Patient has no known allergies.   Review of Systems Review of Systems  Constitutional: Negative for appetite change, chills and fever.  HENT: Negative for rhinorrhea.   Eyes: Negative for photophobia and visual disturbance.  Respiratory: Negative for cough, chest tightness, shortness of breath and wheezing.   Cardiovascular: Negative for chest pain and palpitations.  Gastrointestinal: Negative for abdominal pain, blood in stool, constipation, diarrhea, nausea and vomiting.  Genitourinary: Negative for dysuria.  Musculoskeletal: Negative for myalgias.  Skin: Positive for color change and rash.  Neurological: Negative for dizziness, weakness, light-headedness and headaches.     Physical Exam Updated Vital Signs BP 128/81 (BP Location: Left Arm)   Pulse 79   Temp 97.6 F (36.4 C) (Oral)   Resp 16   LMP 01/01/2017   SpO2 98%   Physical Exam  Constitutional: She appears well-developed and well-nourished. No distress.  HENT:  Head: Normocephalic and atraumatic.  Nose: Nose normal.  Eyes: Conjunctivae and EOM are normal. Left  eye exhibits no discharge. No scleral icterus.  Neck: Normal range of motion. Neck supple.  Cardiovascular: Normal rate, regular rhythm, normal heart sounds and intact distal pulses.  Exam reveals no gallop and no friction rub.   No murmur heard. Pulmonary/Chest: Effort normal and breath sounds normal. No respiratory distress.  Abdominal: Soft. Bowel sounds are normal. She exhibits no distension. There is no tenderness. There is no guarding.  Musculoskeletal: Normal range of motion. She exhibits no edema.  Neurological: She is alert. She exhibits normal muscle tone. Coordination normal.  Skin: Skin is warm and dry. There is erythema.  2cm area of induration and erythema on the R breast tissue on the under side of the breast below the nipple. No overlying cellulitis. No drainage or bleeding from site. Area is severely TTP.  Psychiatric: She has a normal mood and affect.  Nursing note and vitals reviewed.    ED Treatments / Results  Labs (all labs ordered are listed, but only abnormal results are displayed) Labs Reviewed - No data to display  EKG  EKG Interpretation None       Radiology No results found.  Procedures Procedures (including critical care time)  Medications Ordered in ED Medications  oxyCODONE-acetaminophen (PERCOCET/ROXICET) 5-325 MG per tablet 1 tablet (not administered)     Initial Impression / Assessment and Plan / ED Course  I have reviewed the triage vital signs and the nursing notes.  Pertinent labs & imaging results that were available during my care of the patient were reviewed by me and considered in my medical decision making (see chart for details).     Patient's history and symptoms concerning for breast abscess. Patient has a history of recurrent abscesses in the left breast, back and arms. She has gotten surgery in the past and is scheduled for surgery in the next few days for drainage of the abscesses on the left breast.  Patient is not  experiencing systemic symptoms at this time. Patient is afebrile with no history of fever. There is an area of abscess in the breast tissue. Patient is not breast feeding. No overlying cellulitis and no drainage or bleeding present.  Due to the nature of her recurrent abscesses and the location of this current abscess, will [facility] and advised her to call them first thing in the morning to be seen there for further imaging and management. They are closed at this time. We will place on Bactrim to prevent any further infection, Diflucan as patient states she is prone to yeast infections on antibiotics, and pain medication.   Final Clinical Impressions(s) / ED Diagnoses   Final diagnoses:  Breast abscess    New Prescriptions New Prescriptions   FLUCONAZOLE (DIFLUCAN) 200 MG TABLET    Take 1 tablet (200 mg total) by mouth daily.   OXYCODONE-ACETAMINOPHEN (PERCOCET/ROXICET) 5-325 MG TABLET    Take 2 tablets by mouth every 6 (six)  hours as needed for severe pain.   SULFAMETHOXAZOLE-TRIMETHOPRIM (BACTRIM DS,SEPTRA DS) 800-160 MG TABLET    Take 1 tablet by mouth 2 (two) times daily.     Dietrich PatesKhatri, Jeidi Gilles, PA-C 01/29/17 1752    Rolan BuccoBelfi, Melanie, MD 01/29/17 [phone]

## 2017-02-04 ENCOUNTER — Other Ambulatory Visit: Payer: Self-pay | Admitting: Surgery

## 2017-02-07 ENCOUNTER — Encounter (HOSPITAL_COMMUNITY): Payer: Self-pay | Admitting: Emergency Medicine

## 2017-02-07 ENCOUNTER — Emergency Department (HOSPITAL_COMMUNITY)
Admission: EM | Admit: 2017-02-07 | Discharge: 2017-02-07 | Disposition: A | Discharge status: Home / Self Care | Payer: Medicaid Other | Attending: Emergency Medicine | Admitting: Emergency Medicine

## 2017-02-07 DIAGNOSIS — T819XXA Unspecified complication of procedure, initial encounter: Secondary | ICD-10-CM | POA: Insufficient documentation

## 2017-02-07 DIAGNOSIS — Z5321 Procedure and treatment not carried out due to patient leaving prior to being seen by health care provider: Secondary | ICD-10-CM | POA: Insufficient documentation

## 2017-02-07 DIAGNOSIS — Y829 Unspecified medical devices associated with adverse incidents: Secondary | ICD-10-CM | POA: Insufficient documentation

## 2017-02-07 NOTE — ED Triage Notes (Signed)
Patient here with complaints of post-op problem. Reports that she had surgery on breast and buttocks. Hx of Hidradenitis suppurativa. Surgery on 6/4 increased pain, "think wound may be open".

## 2017-04-16 ENCOUNTER — Emergency Department (HOSPITAL_COMMUNITY)
Admission: EM | Admit: 2017-04-16 | Discharge: 2017-04-17 | Disposition: A | Discharge status: Home / Self Care | Payer: Medicaid Other | Attending: Emergency Medicine | Admitting: Emergency Medicine

## 2017-04-16 ENCOUNTER — Encounter (HOSPITAL_COMMUNITY): Payer: Self-pay

## 2017-04-16 DIAGNOSIS — R1030 Lower abdominal pain, unspecified: Secondary | ICD-10-CM | POA: Diagnosis present

## 2017-04-16 DIAGNOSIS — N390 Urinary tract infection, site not specified: Secondary | ICD-10-CM

## 2017-04-16 DIAGNOSIS — R102 Pelvic and perineal pain: Secondary | ICD-10-CM

## 2017-04-16 DIAGNOSIS — O26899 Other specified pregnancy related conditions, unspecified trimester: Secondary | ICD-10-CM

## 2017-04-16 DIAGNOSIS — Z3490 Encounter for supervision of normal pregnancy, unspecified, unspecified trimester: Secondary | ICD-10-CM | POA: Insufficient documentation

## 2017-04-16 DIAGNOSIS — O3680X0 Pregnancy with inconclusive fetal viability, not applicable or unspecified: Secondary | ICD-10-CM

## 2017-04-16 LAB — CBC
HCT: 31.4 % — ABNORMAL LOW (ref 36.0–46.0)
HEMOGLOBIN: 10.1 g/dL — AB (ref 12.0–15.0)
MCH: 24.2 pg — ABNORMAL LOW (ref 26.0–34.0)
MCHC: 32.2 g/dL (ref 30.0–36.0)
MCV: 75.3 fL — ABNORMAL LOW (ref 78.0–100.0)
PLATELETS: 280 10*3/uL (ref 150–400)
RBC: 4.17 MIL/uL (ref 3.87–5.11)
RDW: 17.3 % — ABNORMAL HIGH (ref 11.5–15.5)
WBC: 6.9 10*3/uL (ref 4.0–10.5)

## 2017-04-16 LAB — URINALYSIS, ROUTINE W REFLEX MICROSCOPIC
BILIRUBIN URINE: NEGATIVE
Bacteria, UA: NONE SEEN
GLUCOSE, UA: NEGATIVE mg/dL
HGB URINE DIPSTICK: NEGATIVE
KETONES UR: NEGATIVE mg/dL
NITRITE: NEGATIVE
PH: 7 (ref 5.0–8.0)
PROTEIN: 30 mg/dL — AB
Specific Gravity, Urine: 1.026 (ref 1.005–1.030)

## 2017-04-16 LAB — POC URINE PREG, ED: Preg Test, Ur: POSITIVE — AB

## 2017-04-16 NOTE — ED Triage Notes (Signed)
Pt complains of abdominal pain since yesterday, pt states that she is a week late for her period

## 2017-04-17 ENCOUNTER — Emergency Department (HOSPITAL_COMMUNITY): Payer: Medicaid Other

## 2017-04-17 LAB — COMPREHENSIVE METABOLIC PANEL
ALT: 8 U/L — AB (ref 14–54)
AST: 13 U/L — AB (ref 15–41)
Albumin: 3.9 g/dL (ref 3.5–5.0)
Alkaline Phosphatase: 58 U/L (ref 38–126)
Anion gap: 8 (ref 5–15)
BUN: 7 mg/dL (ref 6–20)
CHLORIDE: 112 mmol/L — AB (ref 101–111)
CO2: 21 mmol/L — AB (ref 22–32)
CREATININE: 0.67 mg/dL (ref 0.44–1.00)
Calcium: 9.1 mg/dL (ref 8.9–10.3)
GFR calc Af Amer: >60 mL/min (ref >60)
GFR calc non Af Amer: >60 mL/min (ref >60)
Glucose, Bld: 99 mg/dL (ref 65–99)
POTASSIUM: 3.7 mmol/L (ref 3.5–5.1)
SODIUM: 141 mmol/L (ref 135–145)
Total Bilirubin: 0.3 mg/dL (ref 0.3–1.2)
Total Protein: 7.3 g/dL (ref 6.5–8.1)

## 2017-04-17 LAB — LIPASE, BLOOD: Lipase: 25 U/L (ref 11–51)

## 2017-04-17 LAB — GC/CHLAMYDIA PROBE AMP (~~LOC~~) NOT AT ARMC
CHLAMYDIA, DNA PROBE: NEGATIVE
Neisseria Gonorrhea: NEGATIVE

## 2017-04-17 LAB — WET PREP, GENITAL
Clue Cells Wet Prep HPF POC: NONE SEEN
SPERM: NONE SEEN
TRICH WET PREP: NONE SEEN
Yeast Wet Prep HPF POC: NONE SEEN

## 2017-04-17 LAB — HCG, QUANTITATIVE, PREGNANCY: HCG, BETA CHAIN, QUANT, S: 333 m[IU]/mL — AB (ref <5)

## 2017-04-17 MED ORDER — NITROFURANTOIN MONOHYD MACRO 100 MG PO CAPS: 100.0000 mg | ORAL_CAPSULE | Freq: Two times a day (BID) | ORAL | 0 refills | Status: DC

## 2017-04-17 MED ORDER — NITROFURANTOIN MONOHYD MACRO 100 MG PO CAPS
100.0000 mg | ORAL_CAPSULE | Freq: Once | ORAL | Status: AC
  Administered 2017-04-17: 100 mg via ORAL
  Filled 2017-04-17: qty 1

## 2017-04-17 NOTE — ED Provider Notes (Signed)
[facility] Provider Note   CSN: [id number] Arrival date & time: 04/16/17  2133     History   Chief Complaint Chief Complaint  Patient presents with  . Abdominal Pain    HPI Sandra Banks is a 36 y.o. female [phone] with a hx of Hidradenitis presents to the Emergency Department complaining of gradual, persistent, progressively worsening suprapubic abdominal pain onset 1 week ago. Associated symptoms include late menstrual cycle. Patient's LMP was July 9th.  Patient reports she is sexually active with one female partner and is not using any contraception. She reports it has been many years since she had an STD. She denies dysuria, hematuria, vaginal discharge, vaginal bleeding. Nothing seems to make her symptoms better or worse. She reports occasional nausea but no vomiting or diarrhea. She denies fevers or chills, weakness, dizziness, syncope, near syncope.   The history is provided by the patient and medical records. No language interpreter was used.    Past Medical History:  Diagnosis Date  . Hidradenitis suppurativa of left axilla   . Hidradenitis suppurativa of right axilla     There are no active problems to display for this patient.   Past Surgical History:  Procedure Laterality Date  . CESAREAN SECTION      OB History    Gravida Para Term Preterm AB Living   6 4   4   5    SAB TAB Ectopic Multiple Live Births                   Home Medications    Prior to Admission medications   Medication Sig Start Date End Date Taking? Authorizing Provider  acetaminophen (TYLENOL) 325 MG tablet Take 650 mg by mouth every 6 (six) hours as needed for mild pain.    Yes [provider]    Family History History reviewed. No pertinent family history.  Social History Social History  Substance Use Topics  . Smoking status: Former Games developer  . Smokeless tobacco: Never Used  . Alcohol use No     Allergies   Patient has no known allergies.   Review of  Systems Review of Systems  Constitutional: Negative for appetite change, diaphoresis, fatigue, fever and unexpected weight change.  HENT: Negative for mouth sores.   Eyes: Negative for visual disturbance.  Respiratory: Negative for cough, chest tightness, shortness of breath and wheezing.   Cardiovascular: Negative for chest pain.  Gastrointestinal: Positive for abdominal pain and nausea. Negative for constipation, diarrhea and vomiting.  Endocrine: Negative for polydipsia, polyphagia and polyuria.  Genitourinary: Negative for dysuria, frequency, hematuria and urgency.  Musculoskeletal: Negative for back pain and neck stiffness.  Skin: Negative for rash.  Allergic/Immunologic: Negative for immunocompromised state.  Neurological: Negative for syncope, light-headedness and headaches.  Hematological: Does not bruise/bleed easily.  Psychiatric/Behavioral: Negative for sleep disturbance. The patient is not nervous/anxious.   All other systems reviewed and are negative.    Physical Exam Updated Vital Signs BP 132/70 (BP Location: Right Arm)   Pulse 78   Temp 98.7 F (37.1 C) (Oral)   Resp 18   LMP 03/03/2017   SpO2 100%   Physical Exam  Constitutional: She appears well-developed and well-nourished. No distress.  Awake, alert, nontoxic appearance  HENT:  Head: Normocephalic and atraumatic.  Mouth/Throat: Oropharynx is clear and moist. No oropharyngeal exudate.  Eyes: Conjunctivae are normal. No scleral icterus.  Neck: Normal range of motion. Neck supple.  Cardiovascular: Normal rate, regular rhythm, normal heart sounds and  intact distal pulses.   No murmur heard. Pulmonary/Chest: Effort normal and breath sounds normal. No respiratory distress. She has no wheezes.  Equal chest expansion  Abdominal: Soft. Bowel sounds are normal. She exhibits no mass. There is no tenderness. There is no rebound and no guarding. Hernia confirmed negative in the right inguinal area and confirmed  negative in the left inguinal area.  Genitourinary: Uterus normal. No labial fusion. There is no rash, tenderness or lesion on the right labia. There is no rash, tenderness or lesion on the left labia. Uterus is not deviated, not enlarged, not fixed and not tender. Cervix exhibits no motion tenderness, no discharge and no friability. Right adnexum displays no mass, no tenderness and no fullness. Left adnexum displays no mass, no tenderness and no fullness. No erythema, tenderness or bleeding in the vagina. No foreign body in the vagina. No signs of injury around the vagina. No vaginal discharge found.  Genitourinary Comments: Cervical os is closed  Musculoskeletal: Normal range of motion. She exhibits no edema.  Lymphadenopathy:       Right: No inguinal adenopathy present.       Left: No inguinal adenopathy present.  Neurological: She is alert.  Speech is clear and goal oriented Moves extremities without ataxia  Skin: Skin is warm and dry. She is not diaphoretic. No erythema.  Psychiatric: She has a normal mood and affect.  Nursing note and vitals reviewed.    ED Treatments / Results  Labs (all labs ordered are listed, but only abnormal results are displayed) Labs Reviewed  WET PREP, GENITAL - Abnormal; Notable for the following:       Result Value   WBC, Wet Prep HPF POC MANY (*)    All other components within normal limits  COMPREHENSIVE METABOLIC PANEL - Abnormal; Notable for the following:    Chloride 112 (*)    CO2 21 (*)    AST 13 (*)    ALT 8 (*)    All other components within normal limits  CBC - Abnormal; Notable for the following:    Hemoglobin 10.1 (*)    HCT 31.4 (*)    MCV 75.3 (*)    MCH 24.2 (*)    RDW 17.3 (*)    All other components within normal limits  URINALYSIS, ROUTINE W REFLEX MICROSCOPIC - Abnormal; Notable for the following:    APPearance HAZY (*)    Protein, ur 30 (*)    Leukocytes, UA SMALL (*)    Squamous Epithelial / LPF 6-30 (*)    All other  components within normal limits  HCG, QUANTITATIVE, PREGNANCY - Abnormal; Notable for the following:    hCG, Beta Chain, Quant, S 333 (*)    All other components within normal limits  POC URINE PREG, ED - Abnormal; Notable for the following:    Preg Test, Ur POSITIVE (*)    All other components within normal limits  URINE CULTURE  LIPASE, BLOOD  GC/CHLAMYDIA PROBE AMP ([place]) NOT AT [facility]    EKG  EKG Interpretation None       Radiology US Ob Comp < 14 Wks  Result Date: 04/17/2017 CLINICAL DATA:  Acute onset of lower abdominal pain. Initial encounter. EXAM: OBSTETRIC <14 WK [place] AND TRANSVAGINAL OB US TECHNIQUE: Both transabdominal and transvaginal ultrasound examinations were performed for complete evaluation of the gestation as well as the maternal uterus, adnexal regions, and pelvic cul-de-sac. Transvaginal technique was performed to assess early pregnancy. COMPARISON:  Pelvic ultrasound performed  12/17/2005 FINDINGS: Intrauterine gestational sac: None seen. Yolk sac:  N/A Embryo:  N/A Subchorionic hemorrhage:  None visualized. Maternal uterus/adnexae: The uterus is unremarkable in appearance. Trace endometrial fluid is noted at the uterine fundus. The ovaries are unremarkable in appearance. The right ovary measures 3.5 x 2.5 x 2.1 cm, while the left ovary measures 4.0 x 2.3 x 2.2 cm. No suspicious adnexal masses are seen; there is no evidence for ovarian torsion. Trace free fluid is seen within the pelvic cul-de-sac. IMPRESSION: 1. No intrauterine gestational sac seen at this time. No evidence of ectopic pregnancy. This remains within normal limits, given the quantitative beta HCG of 333. 2. Trace endometrial fluid noted at the uterine fundus. 3. No evidence of ovarian torsion. Electronically Signed   By: Roanna RaiderJeffery  Chang M.D.   On: 04/17/2017 02:20   [place] Ob Transvaginal  Result Date: 04/17/2017 CLINICAL DATA:  Acute onset of lower abdominal pain. Initial encounter. EXAM: OBSTETRIC <14  WK US AND TRANSVAGINAL OB US TECHNIQUE: Both transabdominal and transvaginal ultrasound examinations were performed for complete evaluation of the gestation as well as the maternal uterus, adnexal regions, and pelvic cul-de-sac. Transvaginal technique was performed to assess early pregnancy. COMPARISON:  Pelvic ultrasound performed 12/17/2005 FINDINGS: Intrauterine gestational sac: None seen. Yolk sac:  N/A Embryo:  N/A Subchorionic hemorrhage:  None visualized. Maternal uterus/adnexae: The uterus is unremarkable in appearance. Trace endometrial fluid is noted at the uterine fundus. The ovaries are unremarkable in appearance. The right ovary measures 3.5 x 2.5 x 2.1 cm, while the left ovary measures 4.0 x 2.3 x 2.2 cm. No suspicious adnexal masses are seen; there is no evidence for ovarian torsion. Trace free fluid is seen within the pelvic cul-de-sac. IMPRESSION: 1. No intrauterine gestational sac seen at this time. No evidence of ectopic pregnancy. This remains within normal limits, given the quantitative beta HCG of 333. 2. Trace endometrial fluid noted at the uterine fundus. 3. No evidence of ovarian torsion. Electronically Signed   By: Roanna RaiderJeffery  Chang M.D.   On: 04/17/2017 02:20    Procedures Procedures (including critical care time)  Medications Ordered in ED Medications  nitrofurantoin (macrocrystal-monohydrate) (MACROBID) capsule 100 mg (not administered)     Initial Impression / Assessment and Plan / ED Course  I have reviewed the triage vital signs and the nursing notes.  Pertinent labs & imaging results that were available during my care of the patient were reviewed by me and considered in my medical decision making (see chart for details).     Patient presents with lower abdominal pain. Positive pregnancy testing Quant of 333. Labs are otherwise reassuring. Urinalysis shows questionable UTI with leukocytes and white blood cells. Will treat with nitrofurantoin. Ultrasound with  undetermined pregnancy location. No adnexal tenderness or palpable masses on my pelvic exam. No vaginal bleeding and no bleeding on pelvic exam. No cervical motion tenderness. Patient will need 48 hour follow-up for repeat Quant.  Patient states understanding and is in agreement with the plan. Discussed reasons to return immediately to the emergency department.  Final Clinical Impressions(s) / ED Diagnoses   Final diagnoses:  Lower abdominal pain  Pregnancy of unknown anatomic location  Urinary tract infection without hematuria, site unspecified    New Prescriptions Current Discharge Medication List       Milta DeitersMuthersbaugh, Jaymarion Trombly, PA-C 04/17/17 0255    Palumbo, April, MD 04/17/17 [phone]

## 2017-04-17 NOTE — Discharge Instructions (Signed)
1. Medications: Macrobid, usual home medications 2. Treatment: rest, drink plenty of fluids, take medications as prescribed 3. Follow Up: Please followup with OB in 48 hours for repeat bloodwork.  Return to the ER for fevers, persistent vomiting, worsening abdominal pain, high fevers, vaginal bleeding or other concerning symptoms.

## 2017-04-18 LAB — URINE CULTURE: Culture: <10000 — AB

## 2017-05-19 ENCOUNTER — Inpatient Hospital Stay (HOSPITAL_COMMUNITY)
Admission: AD | Admit: 2017-05-19 | Discharge: 2017-05-19 | Disposition: A | Discharge status: Home / Self Care | Payer: Medicaid Other | Source: Ambulatory Visit | Attending: Obstetrics & Gynecology | Admitting: Obstetrics & Gynecology

## 2017-05-19 ENCOUNTER — Encounter (HOSPITAL_COMMUNITY): Payer: Self-pay

## 2017-05-19 ENCOUNTER — Inpatient Hospital Stay (HOSPITAL_COMMUNITY): Payer: Medicaid Other

## 2017-05-19 DIAGNOSIS — Z3201 Encounter for pregnancy test, result positive: Secondary | ICD-10-CM | POA: Insufficient documentation

## 2017-05-19 DIAGNOSIS — Z87891 Personal history of nicotine dependence: Secondary | ICD-10-CM | POA: Insufficient documentation

## 2017-05-19 DIAGNOSIS — Z9889 Other specified postprocedural states: Secondary | ICD-10-CM | POA: Diagnosis not present

## 2017-05-19 DIAGNOSIS — O021 Missed abortion: Secondary | ICD-10-CM

## 2017-05-19 DIAGNOSIS — O209 Hemorrhage in early pregnancy, unspecified: Secondary | ICD-10-CM | POA: Diagnosis present

## 2017-05-19 DIAGNOSIS — Z3A15 15 weeks gestation of pregnancy: Secondary | ICD-10-CM | POA: Diagnosis not present

## 2017-05-19 LAB — URINALYSIS, ROUTINE W REFLEX MICROSCOPIC
Bilirubin Urine: NEGATIVE
Glucose, UA: NEGATIVE mg/dL
Ketones, ur: NEGATIVE mg/dL
Nitrite: NEGATIVE
PH: 5 (ref 5.0–8.0)
Protein, ur: NEGATIVE mg/dL
SPECIFIC GRAVITY, URINE: 1.02 (ref 1.005–1.030)

## 2017-05-19 LAB — ABO/RH: ABO/RH(D): O POS

## 2017-05-19 LAB — PREGNANCY, URINE: PREG TEST UR: POSITIVE — AB

## 2017-05-19 LAB — HCG, QUANTITATIVE, PREGNANCY: HCG, BETA CHAIN, QUANT, S: 23742 m[IU]/mL — AB (ref <5)

## 2017-05-19 LAB — POCT PREGNANCY, URINE: Preg Test, Ur: POSITIVE — AB

## 2017-05-19 MED ORDER — OXYCODONE-ACETAMINOPHEN 5-325 MG PO TABS: 1.0000 | ORAL_TABLET | ORAL | 0 refills | Status: DC | PRN

## 2017-05-19 MED ORDER — MISOPROSTOL 200 MCG PO TABS: ORAL_TABLET | ORAL | 0 refills | Status: DC

## 2017-05-19 MED ORDER — IBUPROFEN 600 MG PO TABS: 600.0000 mg | ORAL_TABLET | Freq: Four times a day (QID) | ORAL | 0 refills | Status: DC | PRN

## 2017-05-19 MED ORDER — IBUPROFEN 600 MG PO TABS
600.0000 mg | ORAL_TABLET | Freq: Four times a day (QID) | ORAL | Status: DC | PRN
  Administered 2017-05-19: 600 mg via ORAL
  Filled 2017-05-19: qty 1

## 2017-05-19 MED ORDER — OXYCODONE-ACETAMINOPHEN 5-325 MG PO TABS: 1.0000 | ORAL_TABLET | Freq: Once | ORAL | Status: DC

## 2017-05-19 NOTE — MAU Provider Note (Signed)
History     CSN: [id number]  Arrival date and time: 05/19/17 1207   First Provider Initiated Contact with Patient 05/19/17 1351      Chief Complaint  Patient presents with  . Vaginal Bleeding   Sandra Banks is a 36 y.o. [id number] at [redacted]w[redacted]d by LMP presenting with 2 day history vaginal bleeding. She was seen at [facility] 04/17/2017 for abdominal pain and had a quantitative beta hCG of 333 and ultrasound showing no IUGS and no evidence of ectopic pregnancy. This is her first episode of vaginal bleeding which started as light spotting but progressed to as heavy as a period this morningand necessitating peri-pad. She passed watery discharge and large clots at 0700. Unsure if she passed tissue. She had menstrual-like cramping with bleeding onset but this morning that progressed to severe lower abdominal pain which has continued. No home treatment for pain. She was treated for UTI at her ED visit but culture was negative. Denies antecedent intercourse, dysuria urgency or frequency, irritative vaginal discharge, subjective symptoms of pregnancy.     OB History  Gravida Para Term Preterm AB Living  SAB TAB Ectopic Multiple Live Births               # Outcome Date GA Lbr Len/2nd Weight Sex Delivery Anes PTL Lv  6 Gravida           5 Term           4 Term           3 Term           2 Term           1 Preterm    3 lb 8 oz (1.588 kg) F CS-Unspec          Past Medical History:  Diagnosis Date  . Hidradenitis suppurativa of left axilla   . Hidradenitis suppurativa of right axilla     Past Surgical History:  Procedure Laterality Date  . CESAREAN SECTION      No family history on file.  Social History  Substance Use Topics  . Smoking status: Former Games developer  . Smokeless tobacco: Never Used  . Alcohol use No    Allergies: No Known Allergies  Prescriptions Prior to Admission  Medication Sig Dispense Refill Last Dose  . nitrofurantoin, macrocrystal-monohydrate,  (MACROBID) 100 MG capsule Take 1 capsule (100 mg total) by mouth 2 (two) times daily. (Patient not taking: Reported on 05/19/2017) 10 capsule 0 Completed Course at Unknown time    Review of Systems  Constitutional: Negative for fatigue and fever.  Cardiovascular: Positive for chest pain.  Gastrointestinal: Positive for abdominal pain. Negative for constipation, diarrhea, nausea and vomiting.  Genitourinary: Positive for vaginal bleeding. Negative for dysuria, frequency, urgency and vaginal discharge.  Musculoskeletal: Positive for back pain.   Physical Exam   Blood pressure 126/67, pulse 79, temperature 98.6 F (37 C), temperature source Oral, resp. rate 18, unknown if currently breastfeeding.  Physical Exam  Constitutional: She is oriented to person, place, and time.  Genitourinary:  Genitourinary Comments: NEFG, no blood Speculum: Scant dark blood in vault and at office but no heavy bleeding, no clots, cervix appears clean  Bimanual: Cervix long, external os 1/internal os closed. Uterus is not appreciably enlarged. No adnexal tenderness or masses   Musculoskeletal: Normal range of motion.  Neurological: She is alert and oriented to person, place, and time.  Skin: Skin is warm  and dry.  Psychiatric: She has a normal mood and affect.    MAU Course  Procedures Results for orders placed or performed during the hospital encounter of 05/19/17 (from the past 24 hour(s))  Pregnancy, urine     Status: Abnormal   Collection Time: 05/19/17 12:15 PM  Result Value Ref Range   Preg Test, Ur POSITIVE (A) NEGATIVE  Urinalysis, Routine w reflex microscopic     Status: Abnormal   Collection Time: 05/19/17 12:15 PM  Result Value Ref Range   Color, Urine YELLOW YELLOW   APPearance HAZY (A) CLEAR   Specific Gravity, Urine 1.020 1.005 - 1.030   pH 5.0 5.0 - 8.0   Glucose, UA NEGATIVE NEGATIVE mg/dL   Hgb urine dipstick LARGE (A) NEGATIVE   Bilirubin Urine NEGATIVE NEGATIVE   Ketones, ur  NEGATIVE NEGATIVE mg/dL   Protein, ur NEGATIVE NEGATIVE mg/dL   Nitrite NEGATIVE NEGATIVE   Leukocytes, UA SMALL (A) NEGATIVE   RBC / HPF 0-5 0 - 5 RBC/hpf   WBC, UA 6-30 0 - 5 WBC/hpf   Bacteria, UA RARE (A) NONE SEEN   Squamous Epithelial / LPF 6-30 (A) NONE SEEN   Mucus PRESENT    Ca Oxalate Crys, UA PRESENT   Pregnancy, urine POC     Status: Abnormal   Collection Time: 05/19/17 12:45 PM  Result Value Ref Range   Preg Test, Ur POSITIVE (A) NEGATIVE  hCG, quantitative, pregnancy     Status: Abnormal   Collection Time: 05/19/17  1:48 PM  Result Value Ref Range   hCG, Beta Chain, Quant, S 23,742 (H) <5 mIU/mL  ABO/Rh     Status: None   Collection Time: 05/19/17  1:52 PM  Result Value Ref Range   ABO/RH(D) O POS   US Ob Transvaginal  Result Date: 05/19/2017 CLINICAL DATA:  Pregnant patient with bleeding EXAM: TRANSVAGINAL OB ULTRASOUND TECHNIQUE: Transvaginal ultrasound was performed for complete evaluation of the gestation as well as the maternal uterus, adnexal regions, and pelvic cul-de-sac. COMPARISON:  None. FINDINGS: Intrauterine gestational sac: Single Yolk sac:  Visualized. Embryo:  Visualized. Cardiac Activity: Not Visualized. MSD:   mm    w     d CRL:   1.42 cm fetal pole.  7 w 5 d                  [place] EDC: Subchorionic hemorrhage:  None visualized. Maternal uterus/adnexae: Normal in appearance. IMPRESSION: 1. An intrauterine pregnancy is identified. No fetal heart tones are seen in the 14 mm fetus. The findings are consistent with fetal demise. Findings meet definitive criteria for failed pregnancy. This follows SRU consensus guidelines: Diagnostic Criteria for Nonviable Pregnancy Early in the First Trimester. [facility] [phone]. Electronically Signed   By: Gerome Sam III M.D   On: 05/19/2017 15:38   MDM Discussed at length with couple options of expectant management or Cytotec, D&E. She decided on cytotec.  Assessment and Plan   1. Embryonic demise     Allergies as of 05/19/2017   No Known Allergies     Medication List    STOP taking these medications   nitrofurantoin (macrocrystal-monohydrate) 100 MG capsule Commonly known as:  MACROBID     TAKE these medications   ibuprofen 600 MG tablet Commonly known as:  ADVIL,MOTRIN Take 1 tablet (600 mg total) by mouth every 6 (six) hours as needed for fever or headache.   misoprostol 200 MCG tablet Commonly known as:  CYTOTEC Place  all 4 tablets, 800 MCG, in your vagina at once   oxyCODONE-acetaminophen 5-325 MG tablet Commonly known as:  PERCOCET/ROXICET Take 1 tablet by mouth every 4 (four) hours as needed.            Discharge Care Instructions        Start     Ordered   05/19/17 0000  ibuprofen (ADVIL,MOTRIN) 600 MG tablet  Every 6 hours PRN    Question:  Supervising Provider  Answer:  Willodean Rosenthal   05/19/17 1620   05/19/17 0000  oxyCODONE-acetaminophen (PERCOCET/ROXICET) 5-325 MG tablet  Every 4 hours PRN    Question:  Supervising Provider  Answer:  Willodean Rosenthal   05/19/17 1620   05/19/17 0000  misoprostol (CYTOTEC) 200 MCG tablet    Question:  Supervising Provider  Answer:  Willodean Rosenthal   05/19/17 1620     Follow-up Information    [facility] Follow up in 1 week(s).   Specialty:  Obstetrics and Gynecology Why:  Someone from clinic will call you with an appointment for lab only in 1 week and a visit in 2 weeks Contact information: [address] [place] [address] [phone]          Amberley Hamler CNM 05/19/2017, 2:37 PM

## 2017-05-19 NOTE — Discharge Instructions (Signed)

## 2017-05-19 NOTE — Progress Notes (Addendum)
G6P4 @ approx 7 wks per pt. States "miscarriage". Bleeding since 2 days ago with clearish fluid to clots yesterday and today. States around 05-06 expelled big clot into the toilet.   1330: pt called out. Another nurse went in to assist. Pt wondering when provider will come in to see pt and needs pain med.   1337: RN Joni Reining who went into pt's room made primary nurse RN [facility] aware of pt calling out..   1339: Provider informed. Ordered  HCG levels.   1425: ibuprofen given by RN Joni Reining. Pelvic exam done.   1507: to U/S via ambulatory.   1525: back from U/S. Awaiting results.   1544: pt states pain level is the same. Stated provider was going to order something stronger.  1552: Provider at bs reassessing pt.  1607: pt declines waiting on stronger PO medication. Provider made aware.   Discharge instructions given with pt and SO understanding. Pt left unit via ambulatory with SO

## 2017-05-28 ENCOUNTER — Ambulatory Visit: Payer: Medicaid Other

## 2017-06-04 ENCOUNTER — Encounter: Payer: Medicaid Other | Admitting: Obstetrics and Gynecology

## 2017-06-25 ENCOUNTER — Encounter (HOSPITAL_COMMUNITY): Payer: Self-pay | Admitting: *Deleted

## 2017-06-25 ENCOUNTER — Inpatient Hospital Stay (HOSPITAL_COMMUNITY)
Admission: AD | Admit: 2017-06-25 | Discharge: 2017-06-25 | Disposition: A | Discharge status: Home / Self Care | Payer: Medicaid Other | Source: Ambulatory Visit | Attending: Family Medicine | Admitting: Family Medicine

## 2017-06-25 DIAGNOSIS — O021 Missed abortion: Secondary | ICD-10-CM | POA: Diagnosis not present

## 2017-06-25 DIAGNOSIS — Z3202 Encounter for pregnancy test, result negative: Secondary | ICD-10-CM | POA: Diagnosis not present

## 2017-06-25 DIAGNOSIS — R110 Nausea: Secondary | ICD-10-CM

## 2017-06-25 DIAGNOSIS — N3000 Acute cystitis without hematuria: Secondary | ICD-10-CM

## 2017-06-25 DIAGNOSIS — Z87891 Personal history of nicotine dependence: Secondary | ICD-10-CM | POA: Diagnosis not present

## 2017-06-25 LAB — CBC
HCT: 33.4 % — ABNORMAL LOW (ref 36.0–46.0)
HEMOGLOBIN: 10.6 g/dL — AB (ref 12.0–15.0)
MCH: 23.7 pg — ABNORMAL LOW (ref 26.0–34.0)
MCHC: 31.7 g/dL (ref 30.0–36.0)
MCV: 74.7 fL — ABNORMAL LOW (ref 78.0–100.0)
PLATELETS: 254 10*3/uL (ref 150–400)
RBC: 4.47 MIL/uL (ref 3.87–5.11)
RDW: 17.1 % — ABNORMAL HIGH (ref 11.5–15.5)
WBC: 6 10*3/uL (ref 4.0–10.5)

## 2017-06-25 LAB — URINALYSIS, ROUTINE W REFLEX MICROSCOPIC
Bilirubin Urine: NEGATIVE
GLUCOSE, UA: NEGATIVE mg/dL
Hgb urine dipstick: NEGATIVE
KETONES UR: NEGATIVE mg/dL
Nitrite: POSITIVE — AB
PROTEIN: NEGATIVE mg/dL
Specific Gravity, Urine: 1.03 (ref 1.005–1.030)
pH: 5 (ref 5.0–8.0)

## 2017-06-25 LAB — WET PREP, GENITAL
Sperm: NONE SEEN
TRICH WET PREP: NONE SEEN
Yeast Wet Prep HPF POC: NONE SEEN

## 2017-06-25 LAB — HCG, SERUM, QUALITATIVE: Preg, Serum: NEGATIVE

## 2017-06-25 LAB — POCT PREGNANCY, URINE: Preg Test, Ur: NEGATIVE

## 2017-06-25 MED ORDER — ONDANSETRON 8 MG PO TBDP
8.0000 mg | ORAL_TABLET | Freq: Once | ORAL | Status: AC
  Administered 2017-06-25: 8 mg via ORAL
  Filled 2017-06-25: qty 1

## 2017-06-25 MED ORDER — SULFAMETHOXAZOLE-TRIMETHOPRIM 800-160 MG PO TABS: 1.0000 | ORAL_TABLET | Freq: Two times a day (BID) | ORAL | 0 refills | Status: DC

## 2017-06-25 MED ORDER — ONDANSETRON 4 MG PO TBDP: 4.0000 mg | ORAL_TABLET | Freq: Three times a day (TID) | ORAL | 0 refills | Status: DC | PRN

## 2017-06-25 NOTE — MAU Note (Signed)
Pt reports she has had nausea and vomiting x one week, positive pregnancy test yesterday but reports she had a miscarriage one month ago and did not come in for follow up

## 2017-06-25 NOTE — MAU Provider Note (Signed)
History     CSN: [id number]  Arrival date and time: 06/25/17 1800   First Provider Initiated Contact with Patient 06/25/17 1850      Chief Complaint  Patient presents with  . Nausea  . Emesis  . Possible Pregnancy   HPI  Sandra Banks is a 36 y.o. [id number] female who presents with nausea & concerns for pregnancy. Took cytotec last month for a miscarriage but didn't follow up. States she had a positive pregnancy test at home on Sunday & Monday of this week. Reports nausea for the last 2 weeks. Denies vomiting, diarrhea, constipation, fever/chills, abdominal pain, or vaginal bleeding.   Past Medical History:  Diagnosis Date  . Hidradenitis suppurativa of left axilla   . Hidradenitis suppurativa of right axilla     Past Surgical History:  Procedure Laterality Date  . CESAREAN SECTION      History reviewed. No pertinent family history.  Social History  Substance Use Topics  . Smoking status: Former Games developermoker  . Smokeless tobacco: Never Used  . Alcohol use No    Allergies: No Known Allergies  Prescriptions Prior to Admission  Medication Sig Dispense Refill Last Dose  . ibuprofen (ADVIL,MOTRIN) 600 MG tablet Take 1 tablet (600 mg total) by mouth every 6 (six) hours as needed for fever or headache. 30 tablet 0   . misoprostol (CYTOTEC) 200 MCG tablet Place all 4 tablets, 800 MCG, in your vagina at once 4 tablet 0   . oxyCODONE-acetaminophen (PERCOCET/ROXICET) 5-325 MG tablet Take 1 tablet by mouth every 4 (four) hours as needed. 20 tablet 0     Review of Systems  Constitutional: Negative for chills and fever.  Gastrointestinal: Positive for nausea. Negative for abdominal pain, diarrhea and vomiting.  Genitourinary: Negative for dysuria, vaginal bleeding and vaginal discharge.   Physical Exam   Blood pressure 127/73, pulse 85, temperature 98.4 F (36.9 C), temperature source Oral, resp. rate 15, height 5\' 10"  (1.778 m), weight 263 lb (119.3 kg), SpO2 99 %, unknown if  currently breastfeeding.  Physical Exam  Nursing note and vitals reviewed. Constitutional: She is oriented to person, place, and time. She appears well-developed and well-nourished. No distress.  HENT:  Head: Normocephalic and atraumatic.  Eyes: Conjunctivae are normal. Right eye exhibits no discharge. Left eye exhibits no discharge. No scleral icterus.  Neck: Normal range of motion.  Respiratory: Effort normal. No respiratory distress.  GI: Soft. There is no tenderness.  Neurological: She is alert and oriented to person, place, and time.  Skin: Skin is warm and dry. She is not diaphoretic.  Psychiatric: She has a normal mood and affect. Her behavior is normal. Judgment and thought content normal.    MAU Course  Procedures Results for orders placed or performed during the hospital encounter of 06/25/17 (from the past 24 hour(s))  Urinalysis, Routine w reflex microscopic     Status: Abnormal   Collection Time: 06/25/17  6:15 PM  Result Value Ref Range   Color, Urine YELLOW YELLOW   APPearance HAZY (A) CLEAR   Specific Gravity, Urine 1.030 1.005 - 1.030   pH 5.0 5.0 - 8.0   Glucose, UA NEGATIVE NEGATIVE mg/dL   Hgb urine dipstick NEGATIVE NEGATIVE   Bilirubin Urine NEGATIVE NEGATIVE   Ketones, ur NEGATIVE NEGATIVE mg/dL   Protein, ur NEGATIVE NEGATIVE mg/dL   Nitrite POSITIVE (A) NEGATIVE   Leukocytes, UA MODERATE (A) NEGATIVE   RBC / HPF 0-5 0 - 5 RBC/hpf   WBC, UA 6-30  0 - 5 WBC/hpf   Bacteria, UA MANY (A) NONE SEEN   Squamous Epithelial / LPF 6-30 (A) NONE SEEN   Mucus PRESENT   Pregnancy, urine POC     Status: None   Collection Time: 06/25/17  6:24 PM  Result Value Ref Range   Preg Test, Ur NEGATIVE NEGATIVE  CBC     Status: Abnormal   Collection Time: 06/25/17  6:50 PM  Result Value Ref Range   WBC 6.0 4.0 - 10.5 K/uL   RBC 4.47 3.87 - 5.11 MIL/uL   Hemoglobin 10.6 (L) 12.0 - 15.0 g/dL   HCT 96.0 (L) 45.4 - 09.8 %   MCV 74.7 (L) 78.0 - 100.0 fL   MCH 23.7 (L) 26.0  - 34.0 pg   MCHC 31.7 30.0 - 36.0 g/dL   RDW 11.9 (H) 14.7 - 82.9 %   Platelets 254 150 - 400 K/uL  hCG, serum, qualitative     Status: None   Collection Time: 06/25/17  6:50 PM  Result Value Ref Range   Preg, Serum NEGATIVE NEGATIVE  Wet prep, genital     Status: Abnormal   Collection Time: 06/25/17  6:57 PM  Result Value Ref Range   Yeast Wet Prep HPF POC NONE SEEN NONE SEEN   Trich, Wet Prep NONE SEEN NONE SEEN   Clue Cells Wet Prep HPF POC PRESENT (A) NONE SEEN   WBC, Wet Prep HPF POC MODERATE (A) NONE SEEN   Sperm NONE SEEN     MDM UPT negative. Pt reports +HPT this week so serum pregnancy test ordered which is negative. Zofran given in MAU & pt reports improvement in nausea. GC/CT & wet prep collected per patient request although she is asymptomatic.  U/a + nitrites. Patient denies urinary complaints but states urine is "cloudy" like when she's had UTI's in the past & is requesting tx.  Assessment and Plan  A: 1. Pregnancy examination or test, negative result   2. Acute cystitis without hematuria   3. Non-pregnancy nausea    P: Discharge home Rx bactrim Discussed reasons to return to MAU F/u with PCP prn GC/CT pending   Judeth Horn 06/25/2017, 6:50 PM

## 2017-06-25 NOTE — Discharge Instructions (Signed)
In late 2019, the [facility] will be moving to the [place] [facility]. At that time, the MAU will no longer serve non-pregnant patients. We encourage you to establish care with a provider before that time, so that you can be seen with any GYN concerns, like vaginal discharge, urinary tract infection, etc.. in a timely manner. In order to make the office visit more convenient, the [facility] will be offering evening hours from 4pm-7:30pm on Mondays starting 05/13/17. There will be same-day appointments, walk-in appointments and scheduled appointments available during this time.   ° °[facility] - [phone] ° °For urgent needs, [facility] is also available for management of urgent GYN complaints such as vaginal discharge.  ° °[employer] extends eligibility for family planning services to reduce unintended pregnancies and improve the well-being of children and families. ° ° Eligible individuals whose income is at or below 195% of the federal poverty level and who are:  °- [place] citizens, documented immigrants or qualified aliens;  °- Residents of [place];  °- Not incarcerated; and  °- Not pregnant.  ° °[employer] Contact Information:  °Medical Assistance Clinical Section °Phone: 919-855-4260 °Email: dma.besmart@[facility].[place].gov  ° ° ° °Urinary Tract Infection, Adult °A urinary tract infection (UTI) is an infection of any part of the urinary tract, which includes the kidneys, ureters, bladder, and urethra. These organs make, store, and get rid of urine in the body. UTI can be a bladder infection (cystitis) or kidney infection (pyelonephritis). °What are the causes? °This infection may be caused by fungi, viruses, or bacteria. Bacteria are the most common cause of UTIs. This condition can also be caused by repeated incomplete emptying of the bladder during urination. °What increases the  risk? °This condition is more likely to develop if: °· You ignore your need to urinate or hold urine for long periods of time. °· You do not empty your bladder completely during urination. °· You wipe back to front after urinating or having a bowel movement, if you are female. °· You are uncircumcised, if you are female. °· You are constipated. °· You have a urinary catheter that stays in place (indwelling). °· You have a weak defense (immune) system. °· You have a medical condition that affects your bowels, kidneys, or bladder. °· You have diabetes. °· You take antibiotic medicines frequently or for long periods of time, and the antibiotics no longer work well against certain types of infections (antibiotic resistance). °· You take medicines that irritate your urinary tract. °· You are exposed to chemicals that irritate your urinary tract. °· You are female. ° °What are the signs or symptoms? °Symptoms of this condition include: °· Fever. °· Frequent urination or passing small amounts of urine frequently. °· Needing to urinate urgently. °· Pain or burning with urination. °· Urine that smells bad or unusual. °· Cloudy urine. °· Pain in the lower abdomen or back. °· Trouble urinating. °· Blood in the urine. °· Vomiting or being less hungry than normal. °· Diarrhea or abdominal pain. °· Vaginal discharge, if you are female. ° °How is this diagnosed? °This condition is diagnosed with a medical history and physical exam. You will also need to provide a urine sample to test your urine. Other tests may be done, including: °· Blood tests. °· Sexually transmitted disease (STD) testing. ° °If you have had more than one UTI, a cystoscopy or imaging   studies may be done to determine the cause of the infections. °How is this treated? °Treatment for this condition often includes a combination of two or more of the following: °· Antibiotic medicine. °· Other medicines to treat less common causes of UTI. °· Over-the-counter medicines  to treat pain. °· Drinking enough water to stay hydrated. ° °Follow these instructions at home: °· Take over-the-counter and prescription medicines only as told by your health care provider. °· If you were prescribed an antibiotic, take it as told by your health care provider. Do not stop taking the antibiotic even if you start to feel better. °· Avoid alcohol, caffeine, tea, and carbonated beverages. They can irritate your bladder. °· Drink enough fluid to keep your urine clear or pale yellow. °· Keep all follow-up visits as told by your health care provider. This is important. °· Make sure to: °? Empty your bladder often and completely. Do not hold urine for long periods of time. °? Empty your bladder before and after sex. °? Wipe from front to back after a bowel movement if you are female. Use each tissue one time when you wipe. °Contact a health care provider if: °· You have back pain. °· You have a fever. °· You feel nauseous or vomit. °· Your symptoms do not get better after 3 days. °· Your symptoms go away and then return. °Get help right away if: °· You have severe back pain or lower abdominal pain. °· You are vomiting and cannot keep down any medicines or water. °This information is not intended to replace advice given to you by your health care provider. Make sure you discuss any questions you have with your health care provider. °Document Released: 05/30/2005 Document Revised: 02/01/2016 Document Reviewed: 07/11/2015 °[employer]vier Interactive Patient Education © 2017 [employer] ° °

## 2017-06-26 LAB — GC/CHLAMYDIA PROBE AMP (~~LOC~~) NOT AT ARMC
Chlamydia: NEGATIVE
Neisseria Gonorrhea: NEGATIVE

## 2017-10-05 ENCOUNTER — Encounter (HOSPITAL_COMMUNITY): Payer: Self-pay | Admitting: *Deleted

## 2017-10-05 ENCOUNTER — Inpatient Hospital Stay (HOSPITAL_COMMUNITY): Payer: Medicaid Other

## 2017-10-05 ENCOUNTER — Inpatient Hospital Stay (HOSPITAL_COMMUNITY)
Admission: AD | Admit: 2017-10-05 | Discharge: 2017-10-05 | Disposition: A | Discharge status: Home / Self Care | Payer: Medicaid Other | Source: Ambulatory Visit | Attending: Obstetrics and Gynecology | Admitting: Obstetrics and Gynecology

## 2017-10-05 DIAGNOSIS — O23591 Infection of other part of genital tract in pregnancy, first trimester: Secondary | ICD-10-CM

## 2017-10-05 DIAGNOSIS — O219 Vomiting of pregnancy, unspecified: Secondary | ICD-10-CM

## 2017-10-05 DIAGNOSIS — Z3A08 8 weeks gestation of pregnancy: Secondary | ICD-10-CM | POA: Insufficient documentation

## 2017-10-05 DIAGNOSIS — O26891 Other specified pregnancy related conditions, first trimester: Secondary | ICD-10-CM | POA: Diagnosis not present

## 2017-10-05 DIAGNOSIS — A5901 Trichomonal vulvovaginitis: Secondary | ICD-10-CM

## 2017-10-05 DIAGNOSIS — O210 Mild hyperemesis gravidarum: Secondary | ICD-10-CM | POA: Insufficient documentation

## 2017-10-05 DIAGNOSIS — R109 Unspecified abdominal pain: Secondary | ICD-10-CM

## 2017-10-05 DIAGNOSIS — Z87891 Personal history of nicotine dependence: Secondary | ICD-10-CM | POA: Diagnosis not present

## 2017-10-05 LAB — WET PREP, GENITAL
Sperm: NONE SEEN
YEAST WET PREP: NONE SEEN

## 2017-10-05 LAB — URINALYSIS, ROUTINE W REFLEX MICROSCOPIC
Bilirubin Urine: NEGATIVE
Glucose, UA: NEGATIVE mg/dL
Hgb urine dipstick: NEGATIVE
Ketones, ur: 5 mg/dL — AB
Nitrite: NEGATIVE
PROTEIN: 30 mg/dL — AB
SPECIFIC GRAVITY, URINE: 1.023 (ref 1.005–1.030)
pH: 5 (ref 5.0–8.0)

## 2017-10-05 LAB — POCT PREGNANCY, URINE: PREG TEST UR: POSITIVE — AB

## 2017-10-05 MED ORDER — METRONIDAZOLE 500 MG PO TABS: 500.0000 mg | ORAL_TABLET | Freq: Two times a day (BID) | ORAL | 0 refills | Status: DC

## 2017-10-05 MED ORDER — BUTALBITAL-APAP-CAFFEINE 50-325-40 MG PO TABS
2.0000 | ORAL_TABLET | Freq: Once | ORAL | Status: AC
  Administered 2017-10-05: 2 via ORAL
  Filled 2017-10-05: qty 2

## 2017-10-05 MED ORDER — ONDANSETRON 8 MG PO TBDP
8.0000 mg | ORAL_TABLET | Freq: Once | ORAL | Status: AC
  Administered 2017-10-05: 8 mg via ORAL
  Filled 2017-10-05: qty 1

## 2017-10-05 MED ORDER — ONDANSETRON 8 MG PO TBDP: 8.0000 mg | ORAL_TABLET | Freq: Three times a day (TID) | ORAL | 3 refills | Status: DC | PRN

## 2017-10-05 NOTE — Discharge Instructions (Signed)
Morning Sickness Morning sickness is when you feel sick to your stomach (nauseous) during pregnancy. You may feel sick to your stomach and throw up (vomit). You may feel sick in the morning, but you can feel this way any time of day. Some women feel very sick to their stomach and cannot stop throwing up (hyperemesis gravidarum). Follow these instructions at home:  Only take medicines as told by your doctor.  Take multivitamins as told by your doctor. Taking multivitamins before getting pregnant can stop or lessen the harshness of morning sickness.  Eat dry toast or unsalted crackers before getting out of bed.  Eat 5 to 6 small meals a day.  Eat dry and bland foods like rice and baked potatoes.  Do not drink liquids with meals. Drink between meals.  Do not eat greasy, fatty, or spicy foods.  Have someone cook for you if the smell of food causes you to feel sick or throw up.  If you feel sick to your stomach after taking prenatal vitamins, take them at night or with a snack.  Eat protein when you need a snack (nuts, yogurt, cheese).  Eat unsweetened gelatins for dessert.  Wear a bracelet used for sea sickness (acupressure wristband).  Go to a doctor that puts thin needles into certain body points (acupuncture) to improve how you feel.  Do not smoke.  Use a humidifier to keep the air in your house free of odors.  Get lots of fresh air. Contact a doctor if:  You need medicine to feel better.  You feel dizzy or lightheaded.  You are losing weight. Get help right away if:  You feel very sick to your stomach and cannot stop throwing up.  You pass out (faint). This information is not intended to replace advice given to you by your health care provider. Make sure you discuss any questions you have with your health care provider. Document Released: 09/27/2004 Document Revised: 01/26/2016 Document Reviewed: 02/04/2013 Elsevier Interactive Patient Education  2017 Elsevier  Inc. Preventing Sexually Transmitted Infections, Adult Sexually transmitted infections (STIs) are diseases that are passed (transmitted) from person to person through bodily fluids exchanged during sex or sexual contact. Bodily fluids include saliva, semen, blood, vaginal mucus, and urine. You may have an increased risk for developing an STI if you have unprotected oral, vaginal, or anal sex. Some common STIs include:  Herpes.  Hepatitis B.  Chlamydia.  Gonorrhea.  Syphilis.  HPV (human papillomavirus).  HIV (humanimmunodeficiency virus), the virus that can cause AIDS (acquired immunodeficiency virus).  How can I protect myself from sexually transmitted infections? The only way to completely prevent STIs is not to have sex of any kind (practice abstinence). This includes oral, vaginal, or anal sex. If you are sexually active, take these actions to lower your risk of getting an STI:  Have only one sex partner (be monogamous) or limit the number of sexual partners you have.  Stay up-to-date on immunizations. Certain vaccines can lower your risk of getting certain STIs, such as: ? Hepatitis A and B vaccines. You may have been vaccinated as a young child, but likely need a booster shot as a teen or young adult. ? HPV vaccine. This vaccine is recommended if you are a man under age 35 or a woman under age 35.  Use methods that prevent the exchange of body fluids between partners (barrier protection) every time you have sex. Barrier protection can be used during oral, vaginal, or anal sex. Commonly used barrier  methods include: ? Female condom. ? Female condom. ? Dental dam.  Get tested regularly for STIs. Have your sexual partner get tested regularly as well.  Avoid mixing alcohol, drugs, and sex. Alcohol and drug use can affect your ability to make good decisions and can lead to risky sexual behaviors.  Ask your health care provider about taking pre-exposure prophylaxis (PrEP) to  prevent HIV infection if you: ? Have a HIV-positive sexual partner. ? Have multiple sexual partners or partners who do not know their HIV status, and do not regularly use a condom during sex. ? Use injection drugs and share needles.  Birth control pills, injections, implants, and intrauterine devices (IUDs) do not protect against STIs. To prevent both STIs and pregnancy, always use a condom with another form of birth control. Some STIs, such as herpes, are spread through skin to skin contact. A condom does not protect you from getting such STIs. If you or your partner have herpes and there is an active flare with open sores, avoid all sexual contact. Why are these changes important? Taking steps to practice safe sex protects you and others. Many STIs can be cured. However, some STIs are not curable and will affect you for the rest of your life. STIs can be passed on to another person even if you do not have symptoms. What can happen if changes are not made? Certain STIs may:  Require you to take medicine for the rest of your life.  Affect your ability to have children (your fertility).  Increase your risk for developing another STI or certain serious health conditions, such as: ? Cervical cancer. ? Head and neck cancer. ? Pelvic inflammatory disease (PID) in women. ? Organ damage or damage to other parts of your body, if the infection spreads.  Be passed to a baby during childbirth.  How are sexually transmitted infections treated? If you or your partner know or think that you may have an STI:  Talk with your healthcare provider about what can be done to treat it. Some STIs can be treated and cured with medicines.  For curable STIs, you and your partner should avoid sex during treatment and for several days after treatment is complete.  You and your partner should both be treated at the same time, if there is any chance that your partner is infected as well. If you get treatment but  your partner does not, your partner can re-infect you when you resume sexual contact.  Do not have unprotected sex.  Where to find more information: Learn more about sexually transmitted diseases and infections from:  Centers for Disease Control and Prevention: ? More information about specific STIs: SolutionApps.co.za ? Find places to get sexual health counseling and treatment for free or for a low cost: gettested.TonerPromos.no  [place] Department of Health and Human Services: NotebookPreviews.si.html  Summary  The only way to completely prevent STIs is not to have sex (practice abstinence), including oral, vaginal, or anal sex.  STIs can spread through saliva, semen, blood, vaginal mucus, urine, or sexual contact.  If you do have sex, limit your number of sexual partners and use a barrier protection method every time you have sex.  If you develop an STI, get treated right away and ask your partner to be treated as well. Do not resume having sex until both of you have completed treatment for the STI. This information is not intended to replace advice given to you by your health care provider. Make sure you  discuss any questions you have with your health care provider. Document Released: 08/16/2016 Document Revised: 08/16/2016 Document Reviewed: 08/16/2016 Elsevier Interactive Patient Education  [employer].

## 2017-10-05 NOTE — MAU Note (Signed)
Pt she has been unable to keep anything down for 3 days, feels lightheaded.

## 2017-10-05 NOTE — MAU Provider Note (Signed)
History   [id number] @ apx 8.1 wks in with c/o vomiting for 3 days and nausea and low abd pain for several days as well. Denies vag bleeing or abnormal discharge.   CSN: [id number]  Arrival date & time 10/05/17  1317   None     Chief Complaint  Patient presents with  . Emesis  . Abdominal Pain  . Possible Pregnancy    HPI  Past Medical History:  Diagnosis Date  . Hidradenitis suppurativa of left axilla   . Hidradenitis suppurativa of right axilla     Past Surgical History:  Procedure Laterality Date  . CESAREAN SECTION      History reviewed. No pertinent family history.  Social History   Tobacco Use  . Smoking status: Former Games developer  . Smokeless tobacco: Never Used  Substance Use Topics  . Alcohol use: No  . Drug use: No    OB History    Gravida Para Term Preterm AB Living   7 5 4 1 1 5    SAB TAB Ectopic Multiple Live Births   1              Review of Systems  Constitutional: Negative.   HENT: Negative.   Eyes: Negative.   Respiratory: Negative.   Cardiovascular: Negative.   Gastrointestinal: Positive for abdominal pain, nausea and vomiting.  Endocrine: Negative.   Genitourinary: Negative.   Musculoskeletal: Negative.   Skin: Negative.   Allergic/Immunologic: Negative.   Neurological: Negative.   Hematological: Negative.   Psychiatric/Behavioral: Negative.     Allergies  Patient has no known allergies.  Home Medications    BP 123/71 (BP Location: Right Arm)   Pulse (!) 107   Temp 98.6 F (37 C) (Oral)   Resp 19   Ht 5\' 10"  (1.778 m)   Wt 265 lb (120.2 kg)   LMP 08/09/2017   SpO2 100%   BMI 38.02 kg/m   Physical Exam  Constitutional: She is oriented to person, place, and time. She appears well-developed and well-nourished.  HENT:  Head: Normocephalic.  Eyes: Pupils are equal, round, and reactive to light.  Neck: Normal range of motion.  Cardiovascular: Normal rate, regular rhythm and intact distal pulses.  Pulmonary/Chest: Effort  normal and breath sounds normal.  Abdominal: Soft. Bowel sounds are normal.  Genitourinary: Vagina normal and uterus normal.  Musculoskeletal: Normal range of motion.  Neurological: She is alert and oriented to person, place, and time. She has normal reflexes.  Skin: Skin is warm and dry.  Psychiatric: She has a normal mood and affect. Her behavior is normal. Judgment and thought content normal.    MAU Course  Procedures (including critical care time)  Labs Reviewed  URINALYSIS, ROUTINE W REFLEX MICROSCOPIC - Abnormal; Notable for the following components:      Result Value   APPearance CLOUDY (*)    Ketones, ur 5 (*)    Protein, ur 30 (*)    Leukocytes, UA MODERATE (*)    Bacteria, UA MANY (*)    Squamous Epithelial / LPF 6-30 (*)    All other components within normal limits  POCT PREGNANCY, URINE - Abnormal; Notable for the following components:   Preg Test, Ur POSITIVE (*)    All other components within normal limits   No results found.   1. Abdominal pain in pregnancy, first trimester   2. Nausea and vomiting in pregnancy       MDM  Pt states feeling better since nausea meds given. requestion  sprite to drink. US shows viable iup at 9.2 wks. Cultures done. Wet shows trich and will treat accordingly. Retaining fluids well will d/c home.

## 2017-10-07 LAB — GC/CHLAMYDIA PROBE AMP (~~LOC~~) NOT AT ARMC
Chlamydia: NEGATIVE
Neisseria Gonorrhea: NEGATIVE

## 2017-10-08 LAB — URINE CULTURE: Culture: >=100000 — AB

## 2017-10-09 ENCOUNTER — Other Ambulatory Visit: Payer: Self-pay

## 2017-10-09 DIAGNOSIS — O2341 Unspecified infection of urinary tract in pregnancy, first trimester: Secondary | ICD-10-CM

## 2017-10-09 MED ORDER — CEPHALEXIN 500 MG PO CAPS: 500.0000 mg | ORAL_CAPSULE | Freq: Three times a day (TID) | ORAL | 0 refills | Status: DC

## 2017-10-09 NOTE — Progress Notes (Signed)
Patient notified of positive urine culture and RX sent to pharmacy.  Rolm BookbinderCaroline M Orlander Norwood, CNM 10/09/17 8:18 AM

## 2017-10-12 ENCOUNTER — Other Ambulatory Visit: Payer: Self-pay | Admitting: Advanced Practice Midwife

## 2017-10-12 ENCOUNTER — Encounter: Payer: Self-pay | Admitting: Advanced Practice Midwife

## 2017-10-12 ENCOUNTER — Other Ambulatory Visit (HOSPITAL_COMMUNITY): Payer: Self-pay | Admitting: Obstetrics and Gynecology

## 2017-10-12 MED ORDER — NITROFURANTOIN MONOHYD MACRO 100 MG PO CAPS: 100.0000 mg | ORAL_CAPSULE | Freq: Two times a day (BID) | ORAL | 0 refills | Status: DC

## 2017-10-12 NOTE — Progress Notes (Signed)
+  UC, but resistant to cephalosporin. RX for SunGardMacrobid sent to pharmacy on file, and mychart message sent to patient.  Thressa ShellerHeather Carley Strickling 9:43 AM 10/12/17

## 2018-07-08 ENCOUNTER — Encounter (HOSPITAL_COMMUNITY): Payer: Self-pay | Admitting: Emergency Medicine

## 2018-07-08 ENCOUNTER — Emergency Department (HOSPITAL_COMMUNITY)
Admission: EM | Admit: 2018-07-08 | Discharge: 2018-07-08 | Disposition: A | Discharge status: Home / Self Care | Payer: Medicaid Other | Attending: Emergency Medicine | Admitting: Emergency Medicine

## 2018-07-08 DIAGNOSIS — Z87891 Personal history of nicotine dependence: Secondary | ICD-10-CM | POA: Insufficient documentation

## 2018-07-08 DIAGNOSIS — L0231 Cutaneous abscess of buttock: Secondary | ICD-10-CM | POA: Insufficient documentation

## 2018-07-08 MED ORDER — DOXYCYCLINE HYCLATE 100 MG PO CAPS: 100.0000 mg | ORAL_CAPSULE | Freq: Two times a day (BID) | ORAL | 0 refills | Status: DC

## 2018-07-08 MED ORDER — LIDOCAINE-EPINEPHRINE (PF) 2 %-1:200000 IJ SOLN
20.0000 mL | Freq: Once | INTRAMUSCULAR | Status: AC
  Administered 2018-07-08: 20 mL
  Filled 2018-07-08: qty 20

## 2018-07-08 MED ORDER — DIAZEPAM 5 MG PO TABS
5.0000 mg | ORAL_TABLET | Freq: Once | ORAL | Status: AC
  Administered 2018-07-08: 5 mg via ORAL
  Filled 2018-07-08: qty 1

## 2018-07-08 MED ORDER — IBUPROFEN 200 MG PO TABS
600.0000 mg | ORAL_TABLET | Freq: Once | ORAL | Status: AC
  Administered 2018-07-08: 600 mg via ORAL
  Filled 2018-07-08: qty 3

## 2018-07-08 MED ORDER — FLUCONAZOLE 150 MG PO TABS: 150.0000 mg | ORAL_TABLET | Freq: Every day | ORAL | 0 refills | Status: AC

## 2018-07-08 MED ORDER — DOXYCYCLINE HYCLATE 100 MG PO TABS
100.0000 mg | ORAL_TABLET | Freq: Once | ORAL | Status: AC
  Administered 2018-07-08: 100 mg via ORAL
  Filled 2018-07-08: qty 1

## 2018-07-08 MED ORDER — OXYCODONE-ACETAMINOPHEN 5-325 MG PO TABS
1.0000 | ORAL_TABLET | Freq: Once | ORAL | Status: AC
  Administered 2018-07-08: 1 via ORAL
  Filled 2018-07-08: qty 1

## 2018-07-08 MED ORDER — HYDROCODONE-ACETAMINOPHEN 5-325 MG PO TABS: 1.0000 | ORAL_TABLET | ORAL | 0 refills | Status: DC | PRN

## 2018-07-08 NOTE — ED Provider Notes (Signed)
[facility]-EMERGENCY DEPT Provider Note   CSN: [id number] Arrival date & time: 07/08/18  1049     History   Chief Complaint Chief Complaint  Patient presents with  . Abscess  . Rectal Pain    HPI Sandra Banks is a 37 y.o. female.  HPI 37 year old female presents the emergency department with right anterior gluteal abscess over the past 4 days.  It began draining yesterday.  She reports significant swelling and pain.  No fevers.  Her pain is severe in severity and worse with palpation.  She has had abscess drainage before in the past.   Past Medical History:  Diagnosis Date  . Hidradenitis suppurativa of left axilla   . Hidradenitis suppurativa of right axilla     There are no active problems to display for this patient.   Past Surgical History:  Procedure Laterality Date  . CESAREAN SECTION       OB History    Gravida  7   Para  5   Term  4   Preterm  1   AB  1   Living  5     SAB  1   TAB      Ectopic      Multiple      Live Births               Home Medications    Prior to Admission medications   Medication Sig Start Date End Date Taking? Authorizing Provider  Acetaminophen (TYLENOL PO) Take 1 tablet by mouth daily as needed.   Yes [provider]  Naproxen Sodium (ALEVE PO) Take 1 tablet by mouth daily.   Yes [provider]  doxycycline (VIBRAMYCIN) 100 MG capsule Take 1 capsule (100 mg total) by mouth 2 (two) times daily. 07/08/18   Azalia Bilis, MD  nitrofurantoin, macrocrystal-monohydrate, (MACROBID) 100 MG capsule Take 1 capsule (100 mg total) by mouth 2 (two) times daily. Patient not taking: Reported on 07/08/2018 10/12/17   Armando Reichert, CNM    Family History No family history on file.  Social History Social History   Tobacco Use  . Smoking status: Former Games developer  . Smokeless tobacco: Never Used  Substance Use Topics  . Alcohol use: No  . Drug use: No     Allergies     Patient has no known allergies.   Review of Systems Review of Systems  All other systems reviewed and are negative.    Physical Exam Updated Vital Signs BP 117/70 (BP Location: Left Arm)   Pulse 82   Temp 98.6 F (37 C) (Oral)   Resp 18   LMP 06/25/2018   SpO2 100%   Breastfeeding? Unknown   Physical Exam  Constitutional: She is oriented to person, place, and time. She appears well-developed and well-nourished.  HENT:  Head: Normocephalic.  Eyes: EOM are normal.  Neck: Normal range of motion.  Pulmonary/Chest: Effort normal.  Abdominal: She exhibits no distension.  Genitourinary:  Genitourinary Comments: Large fluctuant abscess of the proximal medial right thigh and anterior gluteal fold.  No labial involvement.  Chaperone present for examination.  Small purulent drainage.  Fluctuance and tenderness.  No significant erythema.  Musculoskeletal: Normal range of motion.  Neurological: She is alert and oriented to person, place, and time.  Psychiatric: She has a normal mood and affect.  Nursing note and vitals reviewed.    ED Treatments / Results  Labs (all labs ordered are listed, but only abnormal  results are displayed) Labs Reviewed - No data to display  EKG None  Radiology No results found.  Procedures .Marland KitchenIncision and Drainage Performed by: Azalia Bilis, MD Authorized by: Azalia Bilis, MD     INCISION AND DRAINAGE Performed by: Azalia Bilis Consent: Verbal consent obtained. Risks and benefits: risks, benefits and alternatives were discussed Time out performed prior to procedure Type: abscess Body area: Right medial thigh and anterior gluteal fold on the right. Anesthesia: local infiltration Incision was made with a scalpel. Local anesthetic: lidocaine 2 % with epinephrine Anesthetic total: 15 ml Complexity: complex Blunt dissection to break up loculations Drainage: purulent Drainage amount: Large Packing material: None Patient tolerance:  Patient tolerated the procedure well with no immediate complications.     Medications Ordered in ED Medications  oxyCODONE-acetaminophen (PERCOCET/ROXICET) 5-325 MG per tablet 1 tablet (1 tablet Oral Given 07/08/18 1153)  ibuprofen (ADVIL,MOTRIN) tablet 600 mg (600 mg Oral Given 07/08/18 1153)  doxycycline (VIBRA-TABS) tablet 100 mg (100 mg Oral Given 07/08/18 1154)  diazepam (VALIUM) tablet 5 mg (5 mg Oral Given 07/08/18 1154)  lidocaine-EPINEPHrine (XYLOCAINE W/EPI) 2 %-1:200000 (PF) injection 20 mL (20 mLs Infiltration Given 07/08/18 1154)     Initial Impression / Assessment and Plan / ED Course  I have reviewed the triage vital signs and the nursing notes.  Pertinent labs & imaging results that were available during my care of the patient were reviewed by me and considered in my medical decision making (see chart for details).     Large abscess.  Drained at the bedside.  Significant purulent material removed.  Home on antibiotics.  Warm water soaks.  Final Clinical Impressions(s) / ED Diagnoses   Final diagnoses:  Abscess, gluteal, right    ED Discharge Orders         Ordered    doxycycline (VIBRAMYCIN) 100 MG capsule  2 times daily     07/08/18 1440           Azalia Bilis, MD 07/08/18 1443

## 2018-07-08 NOTE — ED Triage Notes (Signed)
Pt c/o abscess on right buttock area for about 4 days that has been draining. Hx abscess in axilla areas.  Wants pregnancy test due to menstrual period in October "wasn't normal".

## 2018-08-15 ENCOUNTER — Inpatient Hospital Stay (HOSPITAL_COMMUNITY)
Admission: AD | Admit: 2018-08-15 | Discharge: 2018-08-15 | Disposition: A | Discharge status: Home / Self Care | Payer: Self-pay | Attending: Obstetrics & Gynecology | Admitting: Obstetrics & Gynecology

## 2018-08-15 ENCOUNTER — Other Ambulatory Visit: Payer: Self-pay

## 2018-08-15 ENCOUNTER — Encounter (HOSPITAL_COMMUNITY): Payer: Self-pay | Admitting: *Deleted

## 2018-08-15 ENCOUNTER — Inpatient Hospital Stay (HOSPITAL_COMMUNITY): Payer: Self-pay

## 2018-08-15 DIAGNOSIS — Z3491 Encounter for supervision of normal pregnancy, unspecified, first trimester: Secondary | ICD-10-CM

## 2018-08-15 DIAGNOSIS — O99019 Anemia complicating pregnancy, unspecified trimester: Secondary | ICD-10-CM

## 2018-08-15 DIAGNOSIS — Z3A12 12 weeks gestation of pregnancy: Secondary | ICD-10-CM | POA: Insufficient documentation

## 2018-08-15 DIAGNOSIS — R110 Nausea: Secondary | ICD-10-CM

## 2018-08-15 DIAGNOSIS — O09521 Supervision of elderly multigravida, first trimester: Secondary | ICD-10-CM | POA: Insufficient documentation

## 2018-08-15 DIAGNOSIS — O4690 Antepartum hemorrhage, unspecified, unspecified trimester: Secondary | ICD-10-CM

## 2018-08-15 DIAGNOSIS — O23591 Infection of other part of genital tract in pregnancy, first trimester: Secondary | ICD-10-CM | POA: Insufficient documentation

## 2018-08-15 DIAGNOSIS — Z3A01 Less than 8 weeks gestation of pregnancy: Secondary | ICD-10-CM

## 2018-08-15 DIAGNOSIS — O26891 Other specified pregnancy related conditions, first trimester: Secondary | ICD-10-CM | POA: Insufficient documentation

## 2018-08-15 DIAGNOSIS — D509 Iron deficiency anemia, unspecified: Secondary | ICD-10-CM

## 2018-08-15 DIAGNOSIS — Z87891 Personal history of nicotine dependence: Secondary | ICD-10-CM | POA: Insufficient documentation

## 2018-08-15 DIAGNOSIS — B9689 Other specified bacterial agents as the cause of diseases classified elsewhere: Secondary | ICD-10-CM | POA: Insufficient documentation

## 2018-08-15 DIAGNOSIS — O99011 Anemia complicating pregnancy, first trimester: Secondary | ICD-10-CM | POA: Insufficient documentation

## 2018-08-15 DIAGNOSIS — O4691 Antepartum hemorrhage, unspecified, first trimester: Secondary | ICD-10-CM

## 2018-08-15 DIAGNOSIS — N760 Acute vaginitis: Secondary | ICD-10-CM

## 2018-08-15 DIAGNOSIS — N939 Abnormal uterine and vaginal bleeding, unspecified: Secondary | ICD-10-CM | POA: Insufficient documentation

## 2018-08-15 LAB — URINALYSIS, ROUTINE W REFLEX MICROSCOPIC
Bilirubin Urine: NEGATIVE
Glucose, UA: NEGATIVE mg/dL
Ketones, ur: NEGATIVE mg/dL
Leukocytes, UA: NEGATIVE
Nitrite: NEGATIVE
Protein, ur: NEGATIVE mg/dL
Specific Gravity, Urine: 1.013 (ref 1.005–1.030)
pH: 6 (ref 5.0–8.0)

## 2018-08-15 LAB — CBC
HCT: 29.8 % — ABNORMAL LOW (ref 36.0–46.0)
Hemoglobin: 9.2 g/dL — ABNORMAL LOW (ref 12.0–15.0)
MCH: 22.2 pg — ABNORMAL LOW (ref 26.0–34.0)
MCHC: 30.9 g/dL (ref 30.0–36.0)
MCV: 72 fL — ABNORMAL LOW (ref 80.0–100.0)
Platelets: 244 10*3/uL (ref 150–400)
RBC: 4.14 MIL/uL (ref 3.87–5.11)
RDW: 19.7 % — ABNORMAL HIGH (ref 11.5–15.5)
WBC: 5.9 10*3/uL (ref 4.0–10.5)
nRBC: 0 % (ref 0.0–0.2)

## 2018-08-15 LAB — WET PREP, GENITAL
Sperm: NONE SEEN
Trich, Wet Prep: NONE SEEN
Yeast Wet Prep HPF POC: NONE SEEN

## 2018-08-15 LAB — HCG, QUANTITATIVE, PREGNANCY: hCG, Beta Chain, Quant, S: 30226 m[IU]/mL — ABNORMAL HIGH (ref <5)

## 2018-08-15 MED ORDER — FERROUS SULFATE 325 (65 FE) MG PO TABS: 325.0000 mg | ORAL_TABLET | Freq: Every day | ORAL | 0 refills | Status: AC

## 2018-08-15 MED ORDER — PROMETHAZINE HCL 12.5 MG PO TABS: 12.5000 mg | ORAL_TABLET | Freq: Four times a day (QID) | ORAL | 0 refills | Status: DC | PRN

## 2018-08-15 MED ORDER — METRONIDAZOLE 0.75 % VA GEL: 1.0000 | Freq: Two times a day (BID) | VAGINAL | 0 refills | Status: DC

## 2018-08-15 NOTE — Discharge Instructions (Signed)

## 2018-08-15 NOTE — MAU Note (Signed)
Is preg, probably like 2-3 months.  Just started bleeding. Has not been seen by anyone yet. Little pain, very nauseous.

## 2018-08-15 NOTE — MAU Provider Note (Signed)
History     CSN: [id number]  Arrival date and time: 08/15/18 1207   First Provider Initiated Contact with Patient 08/15/18 1237      Chief Complaint  Patient presents with  . Vaginal Bleeding  . Possible Pregnancy  . Nausea   Sandra Banks is a 37 y.o. G9P6 at 12w by LMP who presents to MAU with complaints of vaginal bleeding and nausea. Patient reports LMP was in September 2019, had spotting in October for 2-3 days then started spotting last night around 2300. She describes vaginal bleeding as dark brown spotting when she wipes. Denies having to wear a pad or panty liner for vaginal bleeding. She denies abdominal pain or cramping. She reports nausea has been occurring for the 2 weeks, denies vomiting- has not taken any medication for nausea.    OB History    Gravida  9   Para  5   Term  4   Preterm  1   AB  1   Living  6     SAB  1   TAB      Ectopic      Multiple      Live Births              Past Medical History:  Diagnosis Date  . Hidradenitis suppurativa of left axilla   . Hidradenitis suppurativa of right axilla     Past Surgical History:  Procedure Laterality Date  . CESAREAN SECTION      History reviewed. No pertinent family history.  Social History   Tobacco Use  . Smoking status: Former Games developermoker  . Smokeless tobacco: Never Used  Substance Use Topics  . Alcohol use: No  . Drug use: No    Allergies: No Known Allergies  Medications Prior to Admission  Medication Sig Dispense Refill Last Dose  . Acetaminophen (TYLENOL PO) Take 1 tablet by mouth daily as needed.   07/08/2018 at Unknown time  . doxycycline (VIBRAMYCIN) 100 MG capsule Take 1 capsule (100 mg total) by mouth 2 (two) times daily. 20 capsule 0   . HYDROcodone-acetaminophen (NORCO/VICODIN) 5-325 MG tablet Take 1 tablet by mouth every 4 (four) hours as needed for moderate pain. 10 tablet 0   . Naproxen Sodium (ALEVE PO) Take 1 tablet by mouth daily.   07/08/2018 at Unknown  time  . nitrofurantoin, macrocrystal-monohydrate, (MACROBID) 100 MG capsule Take 1 capsule (100 mg total) by mouth 2 (two) times daily. (Patient not taking: Reported on 07/08/2018) 14 capsule 0 Not Taking at Unknown time    Review of Systems  Constitutional: Negative.   Respiratory: Negative.   Cardiovascular: Negative.   Gastrointestinal: Positive for nausea. Negative for abdominal pain, constipation, diarrhea and vomiting.  Genitourinary: Positive for vaginal bleeding. Negative for difficulty urinating, dysuria, frequency, pelvic pain, urgency and vaginal pain.  Neurological: Negative.    Physical Exam   Blood pressure 130/67, pulse 89, temperature 97.9 F (36.6 C), temperature source Oral, resp. rate 16, weight 110.3 kg, last menstrual period 05/23/2018, SpO2 100 %, unknown if currently breastfeeding.  Physical Exam  Nursing note and vitals reviewed. Constitutional: She is oriented to person, place, and time. She appears well-developed and well-nourished. No distress.  Cardiovascular: Normal rate, regular rhythm and normal heart sounds.  Respiratory: Effort normal and breath sounds normal. No respiratory distress. She has no wheezes.  GI: Soft. Bowel sounds are normal. She exhibits no distension. There is no abdominal tenderness. There is no rebound and no guarding.  Genitourinary:    Vaginal discharge present.     No vaginal bleeding.  No bleeding in the vagina.    Genitourinary Comments: Pelvic examination: cervix pink, visually closed, large amount of milky greenish dark brown vaginal discharge with strong foul odor present, no active vaginal bleeding noted, negative CMT, no abnormalities of adnexa palpated.    Musculoskeletal: Normal range of motion.  Neurological: She is alert and oriented to person, place, and time.   Unable to obtain FHR by Doppler   MAU Course  Procedures  MDM Orders Placed This Encounter  Procedures  . Wet prep, genital  . US OB LESS THAN 14 WEEKS  WITH OB TRANSVAGINAL  . Urinalysis, Routine w reflex microscopic  . CBC  . hCG, quantitative, pregnancy   Results for orders placed or performed during the hospital encounter of 08/15/18 (from the past 24 hour(s))  Urinalysis, Routine w reflex microscopic     Status: Abnormal   Collection Time: 08/15/18 12:22 PM  Result Value Ref Range   Color, Urine YELLOW YELLOW   APPearance HAZY (A) CLEAR   Specific Gravity, Urine 1.013 1.005 - 1.030   pH 6.0 5.0 - 8.0   Glucose, UA NEGATIVE NEGATIVE mg/dL   Hgb urine dipstick SMALL (A) NEGATIVE   Bilirubin Urine NEGATIVE NEGATIVE   Ketones, ur NEGATIVE NEGATIVE mg/dL   Protein, ur NEGATIVE NEGATIVE mg/dL   Nitrite NEGATIVE NEGATIVE   Leukocytes, UA NEGATIVE NEGATIVE   RBC / HPF 0-5 0 - 5 RBC/hpf   WBC, UA 0-5 0 - 5 WBC/hpf   Bacteria, UA RARE (A) NONE SEEN   Squamous Epithelial / LPF 6-10 0 - 5   Mucus PRESENT   CBC     Status: Abnormal   Collection Time: 08/15/18 12:56 PM  Result Value Ref Range   WBC 5.9 4.0 - 10.5 K/uL   RBC 4.14 3.87 - 5.11 MIL/uL   Hemoglobin 9.2 (L) 12.0 - 15.0 g/dL   HCT 40.9 (L) 81.1 - 91.4 %   MCV 72.0 (L) 80.0 - 100.0 fL   MCH 22.2 (L) 26.0 - 34.0 pg   MCHC 30.9 30.0 - 36.0 g/dL   RDW 78.2 (H) 95.6 - 21.3 %   Platelets 244 150 - 400 K/uL   nRBC 0.0 0.0 - 0.2 %  hCG, quantitative, pregnancy     Status: Abnormal   Collection Time: 08/15/18 12:56 PM  Result Value Ref Range   hCG, Beta Chain, Quant, S 30,226 (H) <5 mIU/mL  Wet prep, genital     Status: Abnormal   Collection Time: 08/15/18  1:13 PM  Result Value Ref Range   Yeast Wet Prep HPF POC NONE SEEN NONE SEEN   Trich, Wet Prep NONE SEEN NONE SEEN   Clue Cells Wet Prep HPF POC PRESENT (A) NONE SEEN   WBC, Wet Prep HPF POC MANY (A) NONE SEEN   Sperm NONE SEEN    US Ob Less Than 14 Weeks With Ob Transvaginal  Result Date: 08/15/2018 CLINICAL DATA:  Vaginal bleeding during pregnancy. Clinical gestational age 110 weeks and 0 days. EXAM: OBSTETRIC <14 WK  [place] AND TRANSVAGINAL OB US TECHNIQUE: Both transabdominal and transvaginal ultrasound examinations were performed for complete evaluation of the gestation as well as the maternal uterus, adnexal regions, and pelvic cul-de-sac. Transvaginal technique was performed to assess early pregnancy. COMPARISON:  None FINDINGS: Intrauterine gestational sac: Single Yolk sac:  Visualized. Embryo:  Visualized. Cardiac Activity: Visualized. Heart Rate: 126 bpm MSD:   mm  w     d CRL:  8.83 mm   6 w   5 d                  [place] EDC: 04/05/2019 Subchorionic hemorrhage:  None visualized. Maternal uterus/adnexae: Subchorionic hemorrhage: None Right ovary: Normal Left ovary: Normal Other :None Free fluid:  None IMPRESSION: 1. Single living intrauterine gestation with an estimated gestational age of [redacted] weeks and 5 days. Note: This is discordant with the clinical gestational age of [redacted] weeks and 0 days. 2. No complications identified. Electronically Signed   By: Signa Kell M.D.   On: 08/15/2018 14:53   Discussed results of [place] with patient, EDD changed based on [place] today- patient is [redacted]w[redacted]d with live IUP. Discussed lab results with positive wet prep and treatment for BV. Patient reports nausea occurring every day throughout the day. Rx for Metrogel sent to pharmacy of choice for treatment of BV due to continued nausea. Rx for Phenergan and iron supplementation sent to pharmacy as well. Discussed reasons to return to MAU. Pt stable prior to discharge home.   Assessment and Plan   1. Normal IUP (intrauterine pregnancy) on prenatal ultrasound, first trimester   2. Vaginal bleeding during pregnancy, antepartum   3. [redacted] weeks gestation of pregnancy   4. Bacterial vaginosis   5. Iron deficiency anemia during pregnancy   6. Nausea    Discharge home  List of OBGYN providers given, encouraged to make appointment to initiate prenatal care  Discussed reasons to return to MAU  Rx for Metrogel, phenergan and iron supplements sent to pharmacy    Allergies as of 08/15/2018   No Known Allergies     Medication List    STOP taking these medications   ALEVE PO   doxycycline 100 MG capsule Commonly known as:  VIBRAMYCIN   HYDROcodone-acetaminophen 5-325 MG tablet Commonly known as:  NORCO/VICODIN   nitrofurantoin (macrocrystal-monohydrate) 100 MG capsule Commonly known as:  MACROBID     TAKE these medications   ferrous sulfate 325 (65 FE) MG tablet Take 1 tablet (325 mg total) by mouth daily.   metroNIDAZOLE 0.75 % vaginal gel Commonly known as:  METROGEL VAGINAL Place 1 Applicatorful vaginally 2 (two) times daily.   promethazine 12.5 MG tablet Commonly known as:  PHENERGAN Take 1 tablet (12.5 mg total) by mouth every 6 (six) hours as needed for nausea or vomiting.   TYLENOL PO Take 1 tablet by mouth daily as needed.       Sharyon Cable CNM 08/15/2018, 15:22 PM

## 2018-08-18 LAB — GC/CHLAMYDIA PROBE AMP (~~LOC~~) NOT AT ARMC
Chlamydia: NEGATIVE
Neisseria Gonorrhea: NEGATIVE

## 2018-08-20 IMAGING — US US OB TRANSVAGINAL
1 series · 13 of 28 positions shown · non-contrast
Comparison: Pelvic ultrasound performed 12/17/2005

CLINICAL DATA: Acute onset of lower abdominal pain. Initial
encounter.

EXAM:
OBSTETRIC <14 WK US AND TRANSVAGINAL OB US
TECHNIQUE: Both transabdominal and transvaginal ultrasound examinations were
performed for complete evaluation of the gestation as well as the
maternal uterus, adnexal regions, and pelvic cul-de-sac.
Transvaginal technique was performed to assess early pregnancy.

[Series 1: us ob transvaginal · 0.22mm/px · 13 of 64 slices shown]
[im 3/64]
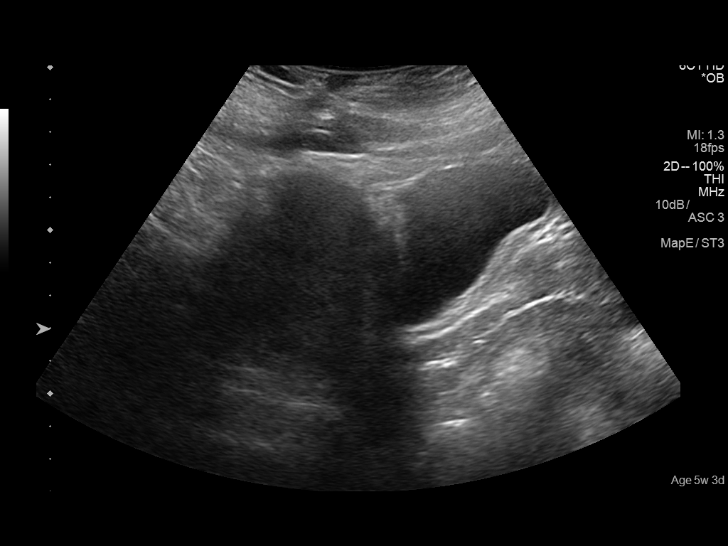
[im 8/64]
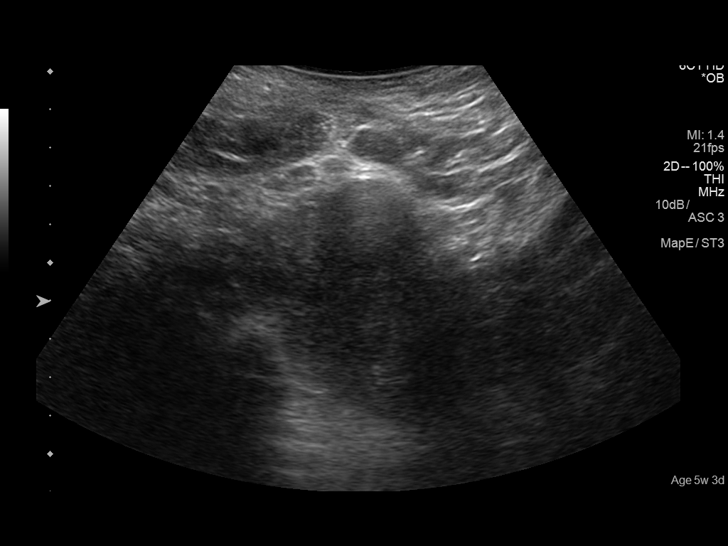
[im 12/64]
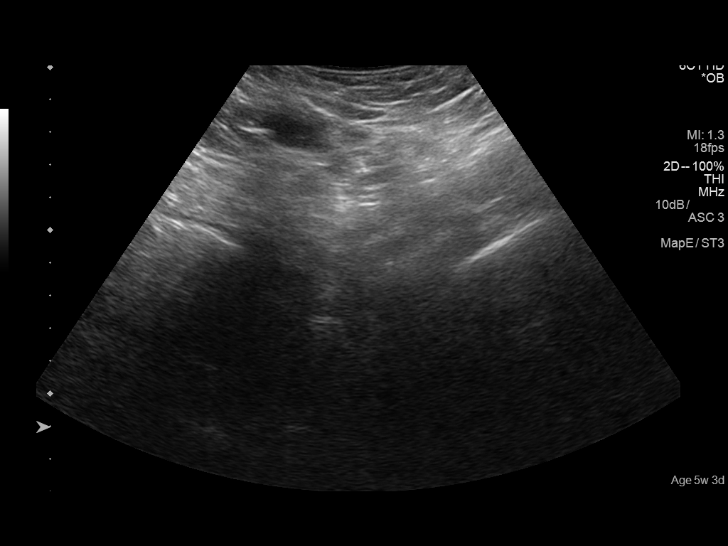
[im 17/64]
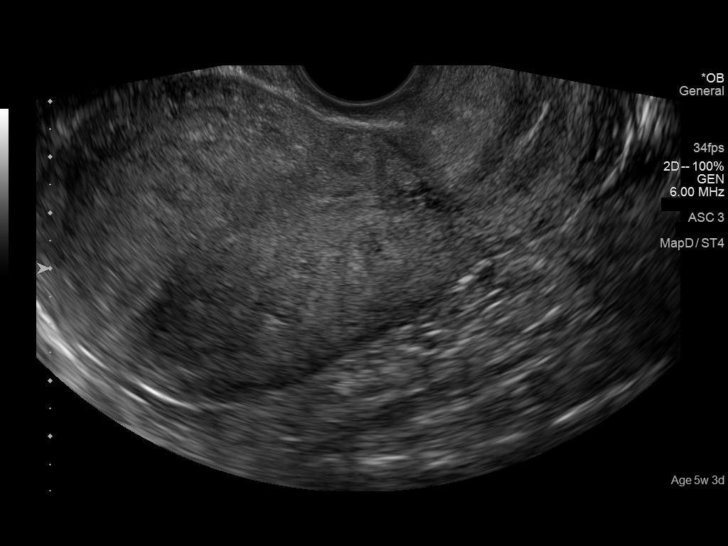
[im 22/64]
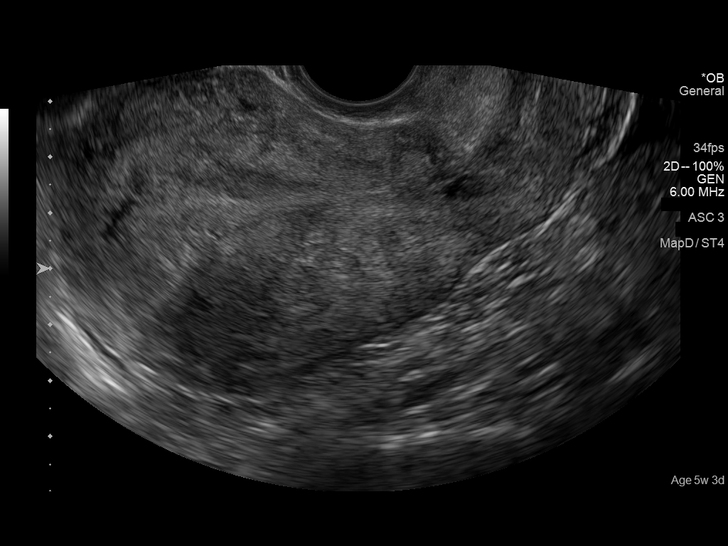
[im 26/64]
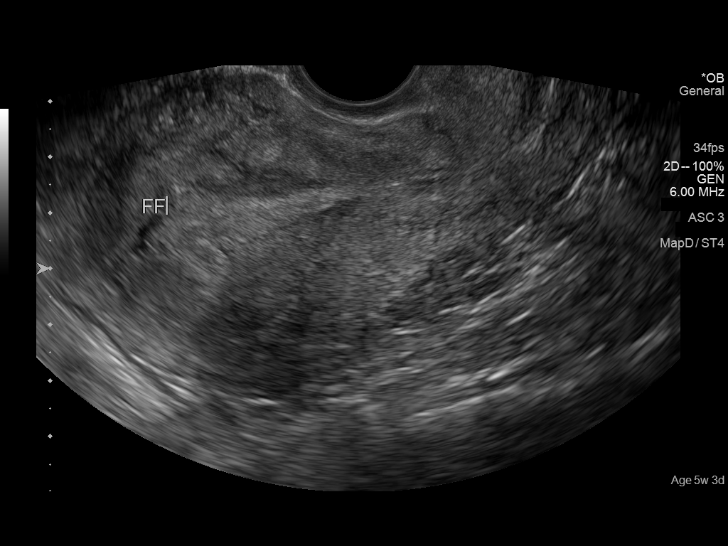
[im 33/64]
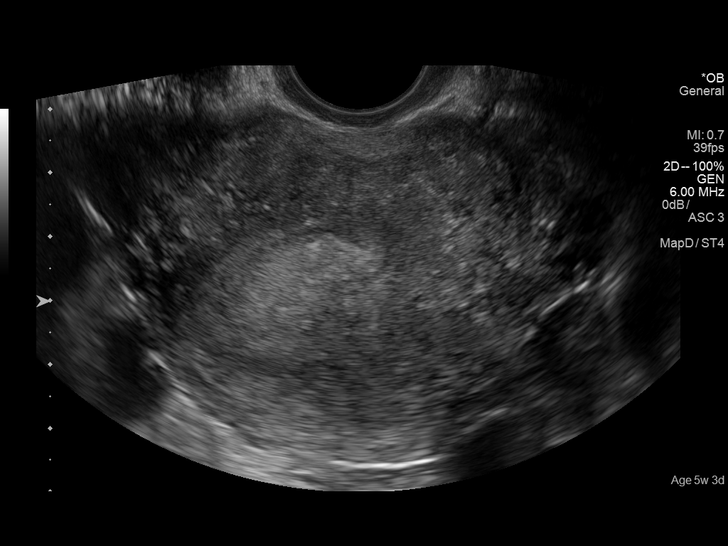
[im 38/64]
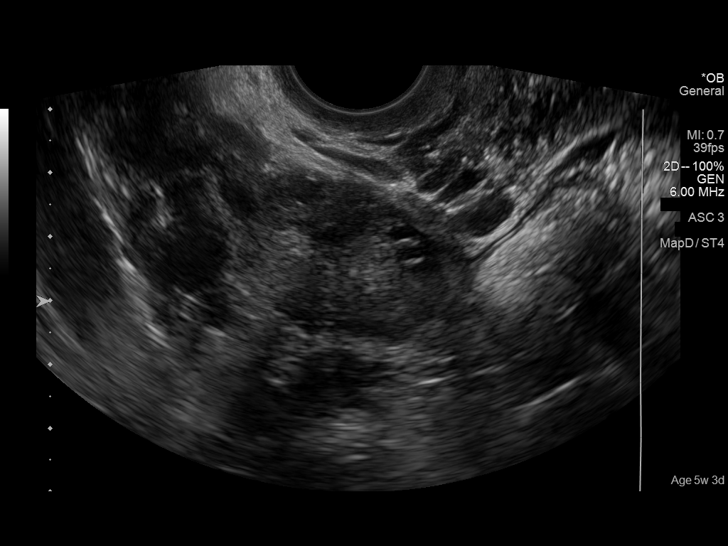
[im 43/64]
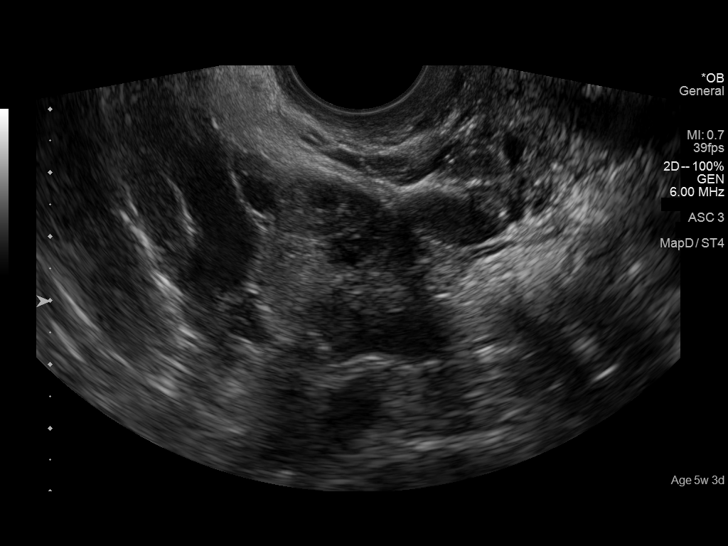
[im 47/64]
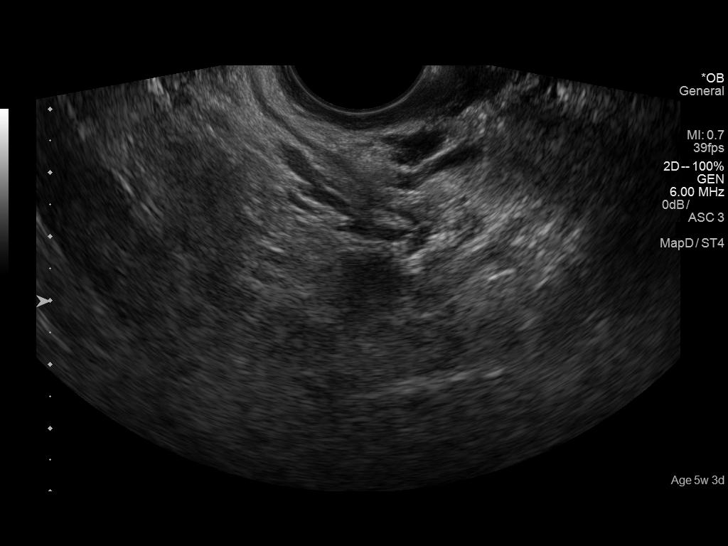
[im 52/64]
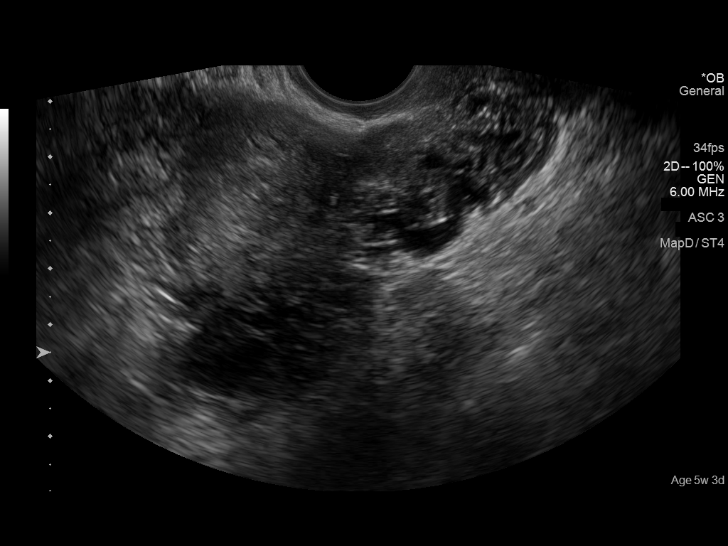
[im 57/64]
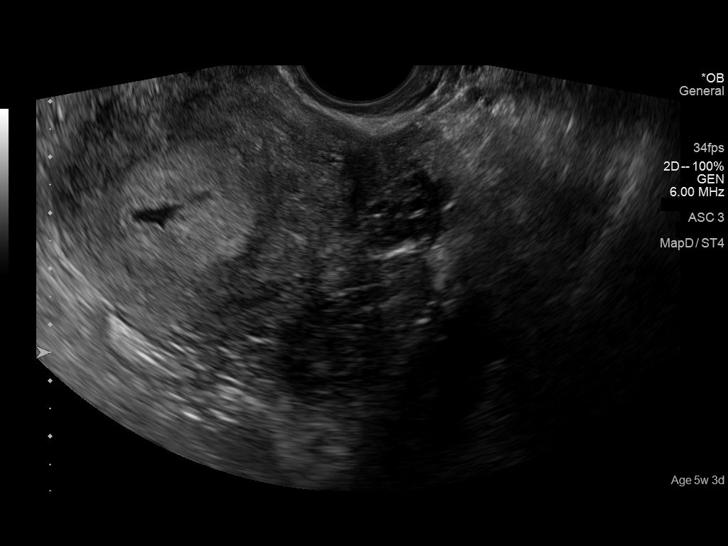
[im 61/64]
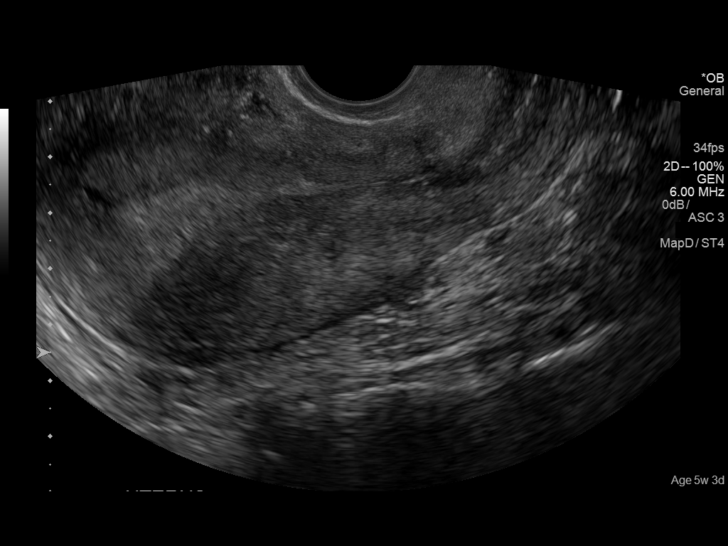

[13 of 28 positions shown; findings below may reference images not displayed]

FINDINGS: Intrauterine gestational sac: None seen.

Yolk sac:  N/A

Embryo:  N/A

Subchorionic hemorrhage:  None visualized.

Maternal uterus/adnexae: The uterus is unremarkable in appearance.
Trace endometrial fluid is noted at the uterine fundus.

The ovaries are unremarkable in appearance. The right ovary measures
3.5 x 2.5 x 2.1 cm, while the left ovary measures 4.0 x 2.3 x
cm. No suspicious adnexal masses are seen; there is no evidence for
ovarian torsion.

Trace free fluid is seen within the pelvic cul-de-sac.
IMPRESSION: 1. No intrauterine gestational sac seen at this time. No evidence of
ectopic pregnancy. This remains within normal limits, given the
quantitative beta HCG of 333.
2. Trace endometrial fluid noted at the uterine fundus.
3. No evidence of ovarian torsion.

## 2018-09-03 ENCOUNTER — Other Ambulatory Visit: Payer: Self-pay

## 2018-09-03 ENCOUNTER — Inpatient Hospital Stay (HOSPITAL_COMMUNITY)
Admission: AD | Admit: 2018-09-03 | Discharge: 2018-09-03 | Disposition: A | Discharge status: Home / Self Care | Payer: Medicaid Other | Source: Ambulatory Visit | Attending: Obstetrics and Gynecology | Admitting: Obstetrics and Gynecology

## 2018-09-03 ENCOUNTER — Encounter (HOSPITAL_COMMUNITY): Payer: Self-pay | Admitting: *Deleted

## 2018-09-03 DIAGNOSIS — O26891 Other specified pregnancy related conditions, first trimester: Secondary | ICD-10-CM | POA: Diagnosis not present

## 2018-09-03 DIAGNOSIS — O99331 Smoking (tobacco) complicating pregnancy, first trimester: Secondary | ICD-10-CM | POA: Insufficient documentation

## 2018-09-03 DIAGNOSIS — O210 Mild hyperemesis gravidarum: Secondary | ICD-10-CM | POA: Diagnosis not present

## 2018-09-03 DIAGNOSIS — Z3A09 9 weeks gestation of pregnancy: Secondary | ICD-10-CM | POA: Diagnosis not present

## 2018-09-03 DIAGNOSIS — F17200 Nicotine dependence, unspecified, uncomplicated: Secondary | ICD-10-CM | POA: Insufficient documentation

## 2018-09-03 LAB — URINALYSIS, ROUTINE W REFLEX MICROSCOPIC
Bilirubin Urine: NEGATIVE
Glucose, UA: NEGATIVE mg/dL
Hgb urine dipstick: NEGATIVE
Ketones, ur: NEGATIVE mg/dL
Leukocytes, UA: NEGATIVE
Nitrite: NEGATIVE
Protein, ur: NEGATIVE mg/dL
Specific Gravity, Urine: 1.026 (ref 1.005–1.030)
pH: 6 (ref 5.0–8.0)

## 2018-09-03 MED ORDER — PROMETHAZINE HCL 25 MG PO TABS
25.0000 mg | ORAL_TABLET | Freq: Once | ORAL | Status: AC
  Administered 2018-09-03: 25 mg via ORAL
  Filled 2018-09-03: qty 1

## 2018-09-03 MED ORDER — PROMETHAZINE HCL 25 MG PO TABS: 12.5000 mg | ORAL_TABLET | Freq: Four times a day (QID) | ORAL | 1 refills | Status: DC | PRN

## 2018-09-03 MED ORDER — ALUM & MAG HYDROXIDE-SIMETH 200-200-20 MG/5ML PO SUSP
30.0000 mL | Freq: Once | ORAL | Status: AC
  Administered 2018-09-03: 30 mL via ORAL
  Filled 2018-09-03: qty 30

## 2018-09-03 NOTE — MAU Provider Note (Addendum)
Chief Complaint: Abdominal Pain; Nausea; and Emesis   First Provider Initiated Contact with Patient 09/03/18 2041        SUBJECTIVE HPI: Sandra Banks is a 38 y.o. [id number] at [redacted]w[redacted]d by LMP who presents to maternity admissions reporting nausea and vomiting "a few times a day" and epigastric pain "from my stomach being empty".  Was seen here on 12/13 and given Rx for Phenergan.  States it was too expensive so has not filled it. . She denies vaginal bleeding, vaginal itching/burning, urinary symptoms, h/a, dizziness, n/v, or fever/chills.    Abdominal Pain  This is a new problem. The current episode started in the past 7 days. The onset quality is gradual. The pain is located in the epigastric region. The quality of the pain is cramping. The abdominal pain does not radiate. Associated symptoms include nausea and vomiting. Pertinent negatives include no constipation, diarrhea, dysuria or fever. Nothing aggravates the pain. The pain is relieved by nothing. She has tried nothing for the symptoms.  Emesis   This is a recurrent problem. The current episode started 1 to 4 weeks ago. The problem occurs 2 to 4 times per day. There has been no fever. Associated symptoms include abdominal pain. Pertinent negatives include no chills, diarrhea or fever. She has tried nothing for the symptoms.   RN Note: Pt presents to MAU c/o NV that is progressively getting worse. Pt states she cant keep anything down. Pt reports abdominal pain that she cant explain. Pt denies any other symptoms  Past Medical History:  Diagnosis Date  . Hidradenitis suppurativa of left axilla   . Hidradenitis suppurativa of right axilla    Past Surgical History:  Procedure Laterality Date  . CESAREAN SECTION     Social History   Socioeconomic History  . Marital status: Single    Spouse name: Not on file  . Number of children: Not on file  . Years of education: Not on file  . Highest education level: Not on file  Occupational  History  . Not on file  Social Needs  . Financial resource strain: Not on file  . Food insecurity:    Worry: Not on file    Inability: Not on file  . Transportation needs:    Medical: Not on file    Non-medical: Not on file  Tobacco Use  . Smoking status: Current Some Day Smoker  . Smokeless tobacco: Never Used  Substance and Sexual Activity  . Alcohol use: No  . Drug use: No  . Sexual activity: Not on file  Lifestyle  . Physical activity:    Days per week: Not on file    Minutes per session: Not on file  . Stress: Not on file  Relationships  . Social connections:    Talks on phone: Not on file    Gets together: Not on file    Attends religious service: Not on file    Active member of club or organization: Not on file    Attends meetings of clubs or organizations: Not on file    Relationship status: Not on file  . Intimate partner violence:    Fear of current or ex partner: Not on file    Emotionally abused: Not on file    Physically abused: Not on file    Forced sexual activity: Not on file  Other Topics Concern  . Not on file  Social History Narrative  . Not on file   No current facility-administered medications on  file prior to encounter.    Current Outpatient Medications on File Prior to Encounter  Medication Sig Dispense Refill  . Acetaminophen (TYLENOL PO) Take 1 tablet by mouth daily as needed.    . ferrous sulfate 325 (65 FE) MG tablet Take 1 tablet (325 mg total) by mouth daily. 30 tablet 0  . metroNIDAZOLE (METROGEL VAGINAL) 0.75 % vaginal gel Place 1 Applicatorful vaginally 2 (two) times daily. 70 g 0   No Known Allergies  I have reviewed patient's Past Medical Hx, Surgical Hx, Family Hx, Social Hx, medications and allergies.   ROS:  Review of Systems  Constitutional: Negative for chills and fever.  Gastrointestinal: Positive for abdominal pain, nausea and vomiting. Negative for constipation and diarrhea.  Genitourinary: Negative for dysuria.     Physical Exam  Physical Exam Patient Vitals for the past 24 hrs:  BP Temp Temp src Pulse Resp Weight  09/03/18 2158 117/67 98.1 F (36.7 C) Oral 78 17 -  09/03/18 1944 129/70 97.8 F (36.6 C) Oral 66 18 110.2 kg   Constitutional: Well-developed, well-nourished female in no acute distress.  Cardiovascular: normal rate Respiratory: normal effort GI: Abd soft, non-tender. Pos BS x 4 MS: Extremities nontender, no edema, normal ROM Neurologic: Alert and oriented x 4.  GU: Neg CVAT.  PELVIC EXAM: deferred  LAB RESULTS Results for orders placed or performed during the hospital encounter of 09/03/18 (from the past 24 hour(s))  Urinalysis, Routine w reflex microscopic     Status: Abnormal   Collection Time: 09/03/18  7:53 PM  Result Value Ref Range   Color, Urine YELLOW YELLOW   APPearance HAZY (A) CLEAR   Specific Gravity, Urine 1.026 1.005 - 1.030   pH 6.0 5.0 - 8.0   Glucose, UA NEGATIVE NEGATIVE mg/dL   Hgb urine dipstick NEGATIVE NEGATIVE   Bilirubin Urine NEGATIVE NEGATIVE   Ketones, ur NEGATIVE NEGATIVE mg/dL   Protein, ur NEGATIVE NEGATIVE mg/dL   Nitrite NEGATIVE NEGATIVE   Leukocytes, UA NEGATIVE NEGATIVE    IMAGING   MAU Management/MDM: Discussed plan of care.  Need to try the Promethazine to see if it will work.   Will try a dose PO here.  Will give a dose of Maalox for epigastric pain.   Care turned over to oncoming midwife Wynelle BourgeoisMarie Williams CNM, MSN Certified Nurse-Midwife 09/04/2018  1:30 AM  Feeling better after meds. Tolerating po. No emesis. No pain. Wants to go home and eat food. Stable for discharge home.   A/P: 1. [redacted] weeks gestation of pregnancy   2. Morning sickness    Discharge home Follow up in [facility] as scheduled Rx Phenergan Hydrate  Allergies as of 09/03/2018   No Known Allergies     Medication List    TAKE these medications   promethazine 25 MG tablet Commonly known as:  PHENERGAN Take 0.5-1 tablets (12.5-25 mg total) by mouth every  6 (six) hours as needed for nausea or vomiting. What changed:    medication strength  how much to take     ASK your doctor about these medications   ferrous sulfate 325 (65 FE) MG tablet Take 1 tablet (325 mg total) by mouth daily.   metroNIDAZOLE 0.75 % vaginal gel Commonly known as:  METROGEL VAGINAL Place 1 Applicatorful vaginally 2 (two) times daily.   TYLENOL PO Take 1 tablet by mouth daily as needed.      Donette LarryBhambri, Natascha Edmonds, CNM  09/04/2018 1:30 AM

## 2018-09-03 NOTE — Discharge Instructions (Signed)

## 2018-09-03 NOTE — MAU Note (Signed)
Pt presents to MAU c/o NV that is progressively getting worse. Pt states she cant keep anything down. Pt reports abdominal pain that she cant explain. Pt denies any other symptoms.

## 2018-09-26 ENCOUNTER — Telehealth: Payer: Self-pay | Admitting: Family Medicine

## 2018-09-26 ENCOUNTER — Encounter: Payer: Self-pay | Admitting: Family Medicine

## 2018-09-26 NOTE — Telephone Encounter (Signed)
Called pt in regards to missed new ob intake to get rescheduled. No answer, LVM to give the office a call back to get rescheduled.

## 2018-10-09 ENCOUNTER — Other Ambulatory Visit: Payer: Self-pay

## 2018-10-09 ENCOUNTER — Encounter (HOSPITAL_COMMUNITY): Payer: Self-pay

## 2018-10-09 ENCOUNTER — Inpatient Hospital Stay (HOSPITAL_COMMUNITY)
Admission: AD | Admit: 2018-10-09 | Discharge: 2018-10-09 | Disposition: A | Discharge status: Home / Self Care | Payer: Medicaid Other | Source: Ambulatory Visit | Attending: Obstetrics & Gynecology | Admitting: Obstetrics & Gynecology

## 2018-10-09 DIAGNOSIS — Z3A14 14 weeks gestation of pregnancy: Secondary | ICD-10-CM | POA: Diagnosis not present

## 2018-10-09 DIAGNOSIS — F17200 Nicotine dependence, unspecified, uncomplicated: Secondary | ICD-10-CM | POA: Diagnosis not present

## 2018-10-09 DIAGNOSIS — O99612 Diseases of the digestive system complicating pregnancy, second trimester: Secondary | ICD-10-CM | POA: Insufficient documentation

## 2018-10-09 DIAGNOSIS — O99332 Smoking (tobacco) complicating pregnancy, second trimester: Secondary | ICD-10-CM | POA: Diagnosis not present

## 2018-10-09 DIAGNOSIS — O210 Mild hyperemesis gravidarum: Secondary | ICD-10-CM | POA: Diagnosis not present

## 2018-10-09 DIAGNOSIS — O219 Vomiting of pregnancy, unspecified: Secondary | ICD-10-CM

## 2018-10-09 DIAGNOSIS — K219 Gastro-esophageal reflux disease without esophagitis: Secondary | ICD-10-CM | POA: Diagnosis not present

## 2018-10-09 HISTORY — DX: Unspecified pre-eclampsia, unspecified trimester: O14.90

## 2018-10-09 HISTORY — DX: Anemia, unspecified: D64.9

## 2018-10-09 LAB — URINALYSIS, ROUTINE W REFLEX MICROSCOPIC
Glucose, UA: NEGATIVE mg/dL
Hgb urine dipstick: NEGATIVE
Ketones, ur: 15 mg/dL — AB
Leukocytes, UA: NEGATIVE
NITRITE: NEGATIVE
Protein, ur: NEGATIVE mg/dL
Specific Gravity, Urine: >1.03 — ABNORMAL HIGH (ref 1.005–1.030)
pH: 5.5 (ref 5.0–8.0)

## 2018-10-09 LAB — BASIC METABOLIC PANEL
Anion gap: 7 (ref 5–15)
BUN: 6 mg/dL (ref 6–20)
CO2: 22 mmol/L (ref 22–32)
Calcium: 8.4 mg/dL — ABNORMAL LOW (ref 8.9–10.3)
Chloride: 103 mmol/L (ref 98–111)
Creatinine, Ser: 0.45 mg/dL (ref 0.44–1.00)
GFR calc Af Amer: >60 mL/min (ref >60)
GFR calc non Af Amer: >60 mL/min (ref >60)
Glucose, Bld: 88 mg/dL (ref 70–99)
Potassium: 3.6 mmol/L (ref 3.5–5.1)
Sodium: 132 mmol/L — ABNORMAL LOW (ref 135–145)

## 2018-10-09 MED ORDER — SODIUM CHLORIDE 0.9 % IV SOLN
INTRAVENOUS | Status: DC
  Administered 2018-10-09: 03:00:00 via INTRAVENOUS

## 2018-10-09 MED ORDER — ONDANSETRON 4 MG PO TBDP: 4.0000 mg | ORAL_TABLET | Freq: Four times a day (QID) | ORAL | 0 refills | Status: DC | PRN

## 2018-10-09 MED ORDER — ONDANSETRON HCL 4 MG/2ML IJ SOLN
4.0000 mg | Freq: Once | INTRAMUSCULAR | Status: AC
  Administered 2018-10-09: 4 mg via INTRAVENOUS
  Filled 2018-10-09: qty 2

## 2018-10-09 MED ORDER — MAGNESIUM HYDROXIDE 400 MG/5ML PO SUSP
30.0000 mL | Freq: Once | ORAL | Status: AC
  Administered 2018-10-09: 30 mL via ORAL
  Filled 2018-10-09: qty 30

## 2018-10-09 MED ORDER — METRONIDAZOLE 500 MG PO TABS: 500.0000 mg | ORAL_TABLET | Freq: Two times a day (BID) | ORAL | 0 refills | Status: DC

## 2018-10-09 NOTE — MAU Note (Signed)
Reports ongoing vomiting.  Has been prescribed promethazine but states all it does is make her sleepy.  Reports last time she was here she was diagnosed with BV and she doesn't think the medicine worked because she has a foul vaginal odor as well.  Is able to sometimes tolerate water-unable to tolerate foods.  Reports she read online that sleeping too much isn't good so she's worried.  Was unable to come to her appointment on 1/24 because it was too early in the AM.

## 2018-10-09 NOTE — MAU Provider Note (Signed)
Chief Complaint:  Emesis   First Provider Initiated Contact with Patient 10/09/18 0234     HPI: Sandra Banks is a 38 y.o. [id number] at 314w4dwho presents to maternity admissions reporting vomiting and inability to keep anything down. Has been going on entire pregnancy.  Phenergan makes her too sleepy  Took Sister's Zofran and it helped but gave her a headache.  . She denies LOF, vaginal bleeding, vaginal itching/burning, urinary symptoms, h/a, dizziness, diarrhea, constipation or fever/chills.    Missed her New OB appt because "it was too early".  States someone cancelled her appt for tomorrow but doesn't know why. When I looked it up, it was because the appt tomorrow was for MD and since she missed the intake appt, she had to reschedule tomorrow's appt.   Emesis   This is a recurrent problem. The current episode started 1 to 4 weeks ago. There has been no fever. Pertinent negatives include no abdominal pain, chills, diarrhea, dizziness or fever. Treatments tried: sister's zofran.   RN Note: Reports ongoing vomiting.  Has been prescribed promethazine but states all it does is make her sleepy.  Reports last time she was here she was diagnosed with BV and she doesn't think the medicine worked because she has a foul vaginal odor as well.  Is able to sometimes tolerate water-unable to tolerate foods.  Reports she read online that sleeping too much isn't good so she's worried.  Was unable to come to her appointment on 1/24 because it was too early in the AM.    Past Medical History: Past Medical History:  Diagnosis Date  . Anemia   . Hidradenitis suppurativa of left axilla   . Hidradenitis suppurativa of right axilla   . Preeclampsia    2005    Past obstetric history: OB History  Gravida Para Term Preterm AB Living  8 5 4 1 1 6   SAB TAB Ectopic Multiple Live Births  1            # Outcome Date GA Lbr Len/2nd Weight Sex Delivery Anes PTL Lv  8 Current           7 SAB 05/2017           6 Gravida      Vag-Spont     5 Term      Vag-Spont     4 Term      Vag-Spont     3 Term      Vag-Spont     2 Term      Vag-Spont     1 Preterm    1588 g F CS-Unspec        Complications: Preeclampsia    Past Surgical History: Past Surgical History:  Procedure Laterality Date  . CESAREAN SECTION    . WISDOM TOOTH EXTRACTION      Family History: History reviewed. No pertinent family history.  Social History: Social History   Tobacco Use  . Smoking status: Current Some Day Smoker  . Smokeless tobacco: Never Used  Substance Use Topics  . Alcohol use: No  . Drug use: No    Allergies: No Known Allergies  Meds:  Medications Prior to Admission  Medication Sig Dispense Refill Last Dose  . Acetaminophen (TYLENOL PO) Take 1 tablet by mouth daily as needed.   07/08/2018 at Unknown time  . ferrous sulfate 325 (65 FE) MG tablet Take 1 tablet (325 mg total) by mouth daily. 30 tablet 0   . metroNIDAZOLE (  METROGEL VAGINAL) 0.75 % vaginal gel Place 1 Applicatorful vaginally 2 (two) times daily. 70 g 0   . promethazine (PHENERGAN) 25 MG tablet Take 0.5-1 tablets (12.5-25 mg total) by mouth every 6 (six) hours as needed for nausea or vomiting. 30 tablet 1     I have reviewed patient's Past Medical Hx, Surgical Hx, Family Hx, Social Hx, medications and allergies.   ROS:  Review of Systems  Constitutional: Negative for chills and fever.  Gastrointestinal: Positive for vomiting. Negative for abdominal pain and diarrhea.  Neurological: Negative for dizziness.   Other systems negative  Physical Exam   Patient Vitals for the past 24 hrs:  BP Temp Pulse Resp SpO2 Height Weight  10/09/18 0216 119/60 99.2 F (37.3 C) 83 17 95 % 5\' 10"  (1.778 m) 108.7 kg   Constitutional: Well-developed, well-nourished female in no acute distress.  Cardiovascular: normal rate and rhythm Respiratory: normal effort, clear to auscultation bilaterally GI: Abd soft, non-tender, gravid appropriate for  gestational age.   No rebound or guarding. MS: Extremities nontender, no edema, normal ROM Neurologic: Alert and oriented x 4.  GU: Neg CVAT.  PELVIC EXAM: deferred  FHT:  148   Labs: Results for orders placed or performed during the hospital encounter of 10/09/18 (from the past 24 hour(s))  Urinalysis, Routine w reflex microscopic     Status: Abnormal   Collection Time: 10/09/18  2:35 AM  Result Value Ref Range   Color, Urine YELLOW YELLOW   APPearance CLEAR CLEAR   Specific Gravity, Urine >1.030 (H) 1.005 - 1.030   pH 5.5 5.0 - 8.0   Glucose, UA NEGATIVE NEGATIVE mg/dL   Hgb urine dipstick NEGATIVE NEGATIVE   Bilirubin Urine SMALL (A) NEGATIVE   Ketones, ur 15 (A) NEGATIVE mg/dL   Protein, ur NEGATIVE NEGATIVE mg/dL   Nitrite NEGATIVE NEGATIVE   Leukocytes, UA NEGATIVE NEGATIVE  Basic metabolic panel     Status: Abnormal   Collection Time: 10/09/18  2:54 AM  Result Value Ref Range   Sodium 132 (L) 135 - 145 mmol/L   Potassium 3.6 3.5 - 5.1 mmol/L   Chloride 103 98 - 111 mmol/L   CO2 22 22 - 32 mmol/L   Glucose, Bld 88 70 - 99 mg/dL   BUN 6 6 - 20 mg/dL   Creatinine, Ser 7.09 0.44 - 1.00 mg/dL   Calcium 8.4 (L) 8.9 - 10.3 mg/dL   GFR calc non Af Amer >60 >60 mL/min   GFR calc Af Amer >60 >60 mL/min   Anion gap 7 5 - 15    Imaging:  No results found.  MAU Course/MDM: I have ordered labs and reviewed results. Potassium normal .  Treatments in MAU included IV hydration and zofran for nausea which helped.  Also given antacid. .    Assessment: Single intrauterine pregnancy at [redacted]w[redacted]d Nausea and vomiting of pregnancy Acid reflux  Plan: Discharge home Rx Flagyl PO, since could not afford Metrogel Rx Zofran for nausea.  Use Phenergan at  Night. List of offices provided for new OB Follow up in Office for prenatal visits and recheck of status  Encouraged to return here or to other Urgent Care/ED if she develops worsening of symptoms, increase in pain, fever, or other  concerning symptoms.   Pt stable at time of discharge.  Wynelle Bourgeois CNM, MSN Certified Nurse-Midwife 10/09/2018 2:54 AM

## 2018-10-09 NOTE — Discharge Instructions (Signed)

## 2018-10-10 ENCOUNTER — Encounter: Payer: Self-pay | Admitting: Obstetrics & Gynecology

## 2018-10-11 ENCOUNTER — Encounter (HOSPITAL_COMMUNITY): Payer: Self-pay

## 2018-10-11 ENCOUNTER — Other Ambulatory Visit: Payer: Self-pay

## 2018-10-11 ENCOUNTER — Emergency Department (HOSPITAL_COMMUNITY)
Admission: EM | Admit: 2018-10-11 | Discharge: 2018-10-11 | Disposition: A | Discharge status: Home / Self Care | Payer: Medicaid Other | Attending: Emergency Medicine | Admitting: Emergency Medicine

## 2018-10-11 DIAGNOSIS — F17210 Nicotine dependence, cigarettes, uncomplicated: Secondary | ICD-10-CM | POA: Insufficient documentation

## 2018-10-11 DIAGNOSIS — Z79899 Other long term (current) drug therapy: Secondary | ICD-10-CM | POA: Insufficient documentation

## 2018-10-11 DIAGNOSIS — L0501 Pilonidal cyst with abscess: Secondary | ICD-10-CM | POA: Insufficient documentation

## 2018-10-11 DIAGNOSIS — L0231 Cutaneous abscess of buttock: Secondary | ICD-10-CM | POA: Diagnosis present
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
## 2018-10-11 MED ORDER — LIDOCAINE HCL (PF) 1 % IJ SOLN
5.0000 mL | Freq: Once | INTRAMUSCULAR | Status: AC
  Administered 2018-10-11: 5 mL via INTRADERMAL
  Filled 2018-10-11: qty 30

## 2018-10-11 MED ORDER — HYDROMORPHONE HCL 2 MG/ML IJ SOLN
2.0000 mg | Freq: Once | INTRAMUSCULAR | Status: AC
  Administered 2018-10-11: 2 mg via INTRAMUSCULAR
  Filled 2018-10-11: qty 1

## 2018-10-11 MED ORDER — CEPHALEXIN 500 MG PO CAPS: 500.0000 mg | ORAL_CAPSULE | Freq: Four times a day (QID) | ORAL | 0 refills | Status: DC

## 2018-10-11 MED ORDER — SULFAMETHOXAZOLE-TRIMETHOPRIM 800-160 MG PO TABS: 1.0000 | ORAL_TABLET | Freq: Two times a day (BID) | ORAL | 0 refills | Status: AC

## 2018-10-11 MED ORDER — HYDROCODONE-ACETAMINOPHEN 5-325 MG PO TABS: 1.0000 | ORAL_TABLET | Freq: Four times a day (QID) | ORAL | 0 refills | Status: DC | PRN

## 2018-10-11 NOTE — ED Triage Notes (Signed)
Pt reports a hx of hidradenitis suppurativa. She states that she had a surgery about 2 years ago to remove some of the spots above her buttocks. She is concerned that the stiches were never dissolved because she states that it still leaks occasionally. She is also reporting a new abscess in that area. A&Ox4. Ambulatory.

## 2018-10-11 NOTE — ED Provider Notes (Addendum)
[facility]-EMERGENCY DEPT Provider Note   CSN: [id number] Arrival date & time: 10/11/18  0142     History   Chief Complaint Chief Complaint  Patient presents with  . Abscess    HPI Sandra Banks is a 38 y.o. female.  Patient is a 38 year old female with history of hidradenitis suppurativa.  She presents today with complaints of swelling and pain to the upper inner aspect of her left buttock.  This is worsened over the past several days.  She denies any fevers or chills.  She denies any injury or trauma.  The history is provided by the patient.  Abscess  Abscess quality: fluctuance, induration, painful and redness   Progression:  Worsening Pain details:    Quality:  Throbbing   Severity:  Severe   Timing:  Constant   Progression:  Worsening Chronicity:  Recurrent Context: not diabetes   Relieved by:  Nothing Exacerbated by: Palpation and sitting.   Past Medical History:  Diagnosis Date  . Anemia   . Hidradenitis suppurativa of left axilla   . Hidradenitis suppurativa of right axilla   . Preeclampsia    2005    There are no active problems to display for this patient.   Past Surgical History:  Procedure Laterality Date  . CESAREAN SECTION    . WISDOM TOOTH EXTRACTION       OB History    Gravida  8   Para  5   Term  4   Preterm  1   AB  1   Living  6     SAB  1   TAB      Ectopic      Multiple      Live Births               Home Medications    Prior to Admission medications   Medication Sig Start Date End Date Taking? Authorizing Provider  Acetaminophen (TYLENOL PO) Take 1 tablet by mouth daily as needed.    [provider]  ferrous sulfate 325 (65 FE) MG tablet Take 1 tablet (325 mg total) by mouth daily. 08/15/18   Sharyon Cable, CNM  metroNIDAZOLE (FLAGYL) 500 MG tablet Take 1 tablet (500 mg total) by mouth 2 (two) times daily. 10/09/18   Aviva Signs, CNM  ondansetron (ZOFRAN ODT) 4  MG disintegrating tablet Take 1 tablet (4 mg total) by mouth every 6 (six) hours as needed for nausea. 10/09/18   Aviva Signs, CNM  promethazine (PHENERGAN) 25 MG tablet Take 0.5-1 tablets (12.5-25 mg total) by mouth every 6 (six) hours as needed for nausea or vomiting. 09/03/18   Donette Larry, CNM    Family History History reviewed. No pertinent family history.  Social History Social History   Tobacco Use  . Smoking status: Current Some Day Smoker  . Smokeless tobacco: Never Used  Substance Use Topics  . Alcohol use: No  . Drug use: No     Allergies   Patient has no known allergies.   Review of Systems Review of Systems  All other systems reviewed and are negative.    Physical Exam Updated Vital Signs BP (!) 118/58 (BP Location: Left Arm)   Pulse 72   Temp 97.6 F (36.4 C) (Oral)   Resp 16   LMP 05/23/2018 (Approximate)   SpO2 97%   Physical Exam Vitals signs and nursing note reviewed.  Constitutional:      Appearance: Normal appearance.  HENT:     Head: Normocephalic and atraumatic.  Pulmonary:     Effort: Pulmonary effort is normal.  Skin:    General: Skin is warm and dry.     Comments: There is a tender, indurated area to the upper inner aspect of the right buttock.  Neurological:     Mental Status: She is alert.      ED Treatments / Results  Labs (all labs ordered are listed, but only abnormal results are displayed) Labs Reviewed - No data to display  EKG None  Radiology No results found.  Procedures Procedures (including critical care time)  Medications Ordered in ED Medications  HYDROmorphone (DILAUDID) injection 2 mg (has no administration in time range)  lidocaine (PF) (XYLOCAINE) 1 % injection 5 mL (has no administration in time range)     Initial Impression / Assessment and Plan / ED Course  I have reviewed the triage vital signs and the nursing notes.  Pertinent labs & imaging results that were available during my care  of the patient were reviewed by me and considered in my medical decision making (see chart for details).  Pilonidal abscess incised and drained.  She will be given Keflex and Bactrim, pain medication, and is to follow-up with general surgery as she desires a possible surgical removal.  INCISION AND DRAINAGE Performed by: Geoffery Lyonsouglas Fatiha Guzy Consent: Verbal consent obtained. Risks and benefits: risks, benefits and alternatives were discussed Type: abscess  Body area: Pilonidal cyst  Anesthesia: local infiltration  Incision was made with a scalpel.  Local anesthetic: lidocaine 1 % without epinephrine  Anesthetic total: 3 ml  Complexity: complex Blunt dissection to break up loculations  Drainage: purulent  Drainage amount: Moderate  Packing material: No packing placed  Patient tolerance: Patient tolerated the procedure well with no immediate complications.  Addendum:  After patient was discharged, I was informed that she called the charge nurse to complain about me stating that she did not feel as though I "got all the stuff out" and that I didn't listen to her when she told me that.  I clearly explained to her while she was here that there may still be some induration to the skin, but the antibiotic should clear this.  I also explained to her that if there was residual purulent material left behind, that frequent warm soaks and sits baths should help resolve this too. I also gave her the number for general surgery to follow up with if she had any additional problems as she may need a more thorough procedure to either remove the abscess core.  Her ability to tolerate the procedure was limited due to a low pain tolerance, even after receiving 2 mg of Dilaudid.   I performed what I felt was an appropriate incision and drainage.    Final Clinical Impressions(s) / ED Diagnoses   Final diagnoses:  None    ED Discharge Orders    None       Geoffery Lyonselo, Naren Benally, MD 10/11/18 [id number]    Geoffery Lyonselo,  Claudene Gatliff, MD 10/11/18 [phone]

## 2018-10-11 NOTE — ED Notes (Signed)
I&D tray at bedside.

## 2018-10-11 NOTE — Discharge Instructions (Signed)
Keflex and Bactrim as prescribed.  Warm soaks or sitz baths as frequently as possible for the next several days.  Follow-up with general surgery.  The contact information for [facility] has been provided in this discharge summary for you to call and make these arrangements.

## 2018-12-13 ENCOUNTER — Inpatient Hospital Stay (HOSPITAL_COMMUNITY)
Admission: AD | Admit: 2018-12-13 | Discharge: 2018-12-14 | Disposition: A | Discharge status: Home / Self Care | Payer: Medicaid Other | Attending: Family Medicine | Admitting: Family Medicine

## 2018-12-13 DIAGNOSIS — Z3A24 24 weeks gestation of pregnancy: Secondary | ICD-10-CM | POA: Insufficient documentation

## 2018-12-13 DIAGNOSIS — O4702 False labor before 37 completed weeks of gestation, second trimester: Secondary | ICD-10-CM

## 2018-12-14 ENCOUNTER — Encounter (HOSPITAL_COMMUNITY): Payer: Self-pay

## 2018-12-14 ENCOUNTER — Other Ambulatory Visit: Payer: Self-pay

## 2018-12-14 DIAGNOSIS — O4702 False labor before 37 completed weeks of gestation, second trimester: Secondary | ICD-10-CM

## 2018-12-14 DIAGNOSIS — Z3A24 24 weeks gestation of pregnancy: Secondary | ICD-10-CM

## 2018-12-14 LAB — URINALYSIS, ROUTINE W REFLEX MICROSCOPIC
Bilirubin Urine: NEGATIVE
Glucose, UA: NEGATIVE mg/dL
Hgb urine dipstick: NEGATIVE
Ketones, ur: NEGATIVE mg/dL
Leukocytes,Ua: NEGATIVE
Nitrite: NEGATIVE
Protein, ur: NEGATIVE mg/dL
Specific Gravity, Urine: 1.025 (ref 1.005–1.030)
pH: 6 (ref 5.0–8.0)

## 2018-12-14 LAB — WET PREP, GENITAL
Clue Cells Wet Prep HPF POC: NONE SEEN
Sperm: NONE SEEN
Trich, Wet Prep: NONE SEEN
Yeast Wet Prep HPF POC: NONE SEEN

## 2018-12-14 LAB — FETAL FIBRONECTIN: Fetal Fibronectin: NEGATIVE

## 2018-12-14 MED ORDER — NIFEDIPINE 10 MG PO CAPS: 10.0000 mg | ORAL_CAPSULE | Freq: Four times a day (QID) | ORAL | 0 refills | Status: DC | PRN

## 2018-12-14 MED ORDER — NIFEDIPINE 10 MG PO CAPS
10.0000 mg | ORAL_CAPSULE | ORAL | Status: DC | PRN
  Administered 2018-12-14 (×2): 10 mg via ORAL
  Filled 2018-12-14 (×3): qty 1

## 2018-12-14 NOTE — MAU Provider Note (Signed)
CC:  Chief Complaint  Patient presents with  . Abdominal Pain     First Provider Initiated Contact with Patient 12/14/18 0008      HPI: Sandra Banks is a 38 y.o. year old [phone] female at [redacted]w[redacted]d weeks gestation who presents to MAU reporting painful contractions every 30 minutes for the past 2 days.  No improvement with rest and p.o. hydration.  Associated Sx: Negative for fever, chills, urinary complaints, GI complaints, vaginal discharge, vaginal bleeding.  No recent sexual intercourse. Vaginal bleeding: Denies Leaking of fluid: Denies Fetal movement: Denies  No prenatal care.  Has canceled or no showed for new OB appointments x3.  History of late spontaneous preterm birth x1.  O:  Patient Vitals for the past 24 hrs:  BP Temp Temp src Pulse Resp SpO2 Height Weight  12/14/18 0100 135/66 - - 64 - - - -  12/14/18 0022 133/60 98.2 F (36.8 C) Oral 67 16 100 % - -  12/14/18 0010 - - - - - 100 % - -  12/14/18 0009 - - - - - 100 % - -  12/13/18 2349 - - - - - - 5\' 10"  (1.778 m) 108 kg    General: NAD Heart: Regular rate Lungs: Normal rate and effort Abd: Soft, NT, Gravid, S=D Pelvic: NEFG, no blood.  Dilation: Closed Effacement (%): Thick Exam by:: Dorathy Kinsman CNM  EFM: 145, Moderate variability, 10 X 10 accelerations, no decelerations Toco: Contractions palpated.  Unable to trace them clearly.  Results for orders placed or performed during the hospital encounter of 12/13/18 (from the past 24 hour(s))  Urinalysis, Routine w reflex microscopic     Status: Abnormal   Collection Time: 12/14/18 12:06 AM  Result Value Ref Range   Color, Urine YELLOW YELLOW   APPearance HAZY (A) CLEAR   Specific Gravity, Urine 1.025 1.005 - 1.030   pH 6.0 5.0 - 8.0   Glucose, UA NEGATIVE NEGATIVE mg/dL   Hgb urine dipstick NEGATIVE NEGATIVE   Bilirubin Urine NEGATIVE NEGATIVE   Ketones, ur NEGATIVE NEGATIVE mg/dL   Protein, ur NEGATIVE NEGATIVE mg/dL   Nitrite NEGATIVE NEGATIVE   Leukocytes,Ua NEGATIVE NEGATIVE  Fetal fibronectin     Status: None   Collection Time: 12/14/18 12:18 AM  Result Value Ref Range   Fetal Fibronectin NEGATIVE NEGATIVE  Wet prep, genital     Status: Abnormal   Collection Time: 12/14/18 12:19 AM  Result Value Ref Range   Yeast Wet Prep HPF POC NONE SEEN NONE SEEN   Trich, Wet Prep NONE SEEN NONE SEEN   Clue Cells Wet Prep HPF POC NONE SEEN NONE SEEN   WBC, Wet Prep HPF POC MODERATE (A) NONE SEEN   Sperm NONE SEEN      Orders Placed This Encounter  Procedures  . Wet prep, genital  . Urinalysis, Routine w reflex microscopic  . Fetal fibronectin  . Encourage fluids  . Discharge patient   Meds ordered this encounter  Medications  . NIFEdipine (PROCARDIA) capsule 10 mg  . NIFEdipine (PROCARDIA) 10 MG capsule    Sig: Take 1 capsule (10 mg total) by mouth every 6 (six) hours as needed (for greater than 5 contractions per hour).    Dispense:  30 capsule    Refill:  0    Order Specific Question:   Supervising Provider    Answer:   Samara Snide   MDM - Contractions resolved after 2 doses of Procardia.  Patient reports feeling  better.  Fetal fibronectin negative.  A: 1768w0d week IUP Preterm contractions without evidence of active preterm labor FHR reactive  P: Discharge home in stable condition. Preterm labor precautions and fetal kick counts. In basket message sent to [facility] to reschedule new OB appointment. Return to maternity admissions as needed if symptoms worsen.  Katrinka BlazingSmith, [place], CNM 12/14/2018 1:43 AM  3

## 2018-12-14 NOTE — MAU Note (Signed)
Presents with lower abd pain since around 2000. Pain 8/10. No vag bldg or LOF.Pos FM  Adah Perl RN

## 2018-12-14 NOTE — Discharge Instructions (Signed)

## 2018-12-15 LAB — GC/CHLAMYDIA PROBE AMP (~~LOC~~) NOT AT ARMC
Chlamydia: NEGATIVE
Neisseria Gonorrhea: NEGATIVE

## 2018-12-24 ENCOUNTER — Telehealth: Payer: Self-pay | Admitting: Obstetrics and Gynecology

## 2018-12-24 NOTE — Telephone Encounter (Signed)
The patient called in to schedule an appointment. Informed the patient of the next available appointment for New Ob visit and also informed of the discharge summary instruction to schedule an appointment with [facility] location (patient with the clinic-most recent). The patient became upset, explained the COVID19 restrictions and the first visit being scheduled as a virtual. The patient stated she would follow up with the [facility] location.

## 2018-12-30 ENCOUNTER — Other Ambulatory Visit: Payer: Self-pay

## 2018-12-30 ENCOUNTER — Inpatient Hospital Stay (HOSPITAL_COMMUNITY)
Admission: AD | Admit: 2018-12-30 | Discharge: 2018-12-30 | Disposition: A | Discharge status: Home / Self Care | Payer: Medicaid Other | Attending: Family Medicine | Admitting: Family Medicine

## 2018-12-30 ENCOUNTER — Encounter (HOSPITAL_COMMUNITY): Payer: Self-pay | Admitting: *Deleted

## 2018-12-30 ENCOUNTER — Telehealth: Payer: Self-pay | Admitting: General Practice

## 2018-12-30 ENCOUNTER — Ambulatory Visit (INDEPENDENT_AMBULATORY_CARE_PROVIDER_SITE_OTHER): Payer: Self-pay | Admitting: *Deleted

## 2018-12-30 DIAGNOSIS — O9989 Other specified diseases and conditions complicating pregnancy, childbirth and the puerperium: Secondary | ICD-10-CM

## 2018-12-30 DIAGNOSIS — Z3A26 26 weeks gestation of pregnancy: Secondary | ICD-10-CM | POA: Diagnosis not present

## 2018-12-30 DIAGNOSIS — B9689 Other specified bacterial agents as the cause of diseases classified elsewhere: Secondary | ICD-10-CM | POA: Diagnosis not present

## 2018-12-30 DIAGNOSIS — O0990 Supervision of high risk pregnancy, unspecified, unspecified trimester: Secondary | ICD-10-CM | POA: Insufficient documentation

## 2018-12-30 DIAGNOSIS — Z3480 Encounter for supervision of other normal pregnancy, unspecified trimester: Secondary | ICD-10-CM

## 2018-12-30 DIAGNOSIS — O99012 Anemia complicating pregnancy, second trimester: Secondary | ICD-10-CM | POA: Insufficient documentation

## 2018-12-30 DIAGNOSIS — O09212 Supervision of pregnancy with history of pre-term labor, second trimester: Secondary | ICD-10-CM

## 2018-12-30 DIAGNOSIS — O0992 Supervision of high risk pregnancy, unspecified, second trimester: Secondary | ICD-10-CM

## 2018-12-30 DIAGNOSIS — D649 Anemia, unspecified: Secondary | ICD-10-CM | POA: Diagnosis not present

## 2018-12-30 DIAGNOSIS — O09219 Supervision of pregnancy with history of pre-term labor, unspecified trimester: Secondary | ICD-10-CM

## 2018-12-30 DIAGNOSIS — O09299 Supervision of pregnancy with other poor reproductive or obstetric history, unspecified trimester: Secondary | ICD-10-CM | POA: Insufficient documentation

## 2018-12-30 DIAGNOSIS — N760 Acute vaginitis: Secondary | ICD-10-CM | POA: Diagnosis not present

## 2018-12-30 DIAGNOSIS — Z98891 History of uterine scar from previous surgery: Secondary | ICD-10-CM

## 2018-12-30 DIAGNOSIS — O09292 Supervision of pregnancy with other poor reproductive or obstetric history, second trimester: Secondary | ICD-10-CM

## 2018-12-30 DIAGNOSIS — O34219 Maternal care for unspecified type scar from previous cesarean delivery: Secondary | ICD-10-CM | POA: Insufficient documentation

## 2018-12-30 DIAGNOSIS — O09899 Supervision of other high risk pregnancies, unspecified trimester: Secondary | ICD-10-CM | POA: Insufficient documentation

## 2018-12-30 LAB — WET PREP, GENITAL
Sperm: NONE SEEN
Trich, Wet Prep: NONE SEEN
Yeast Wet Prep HPF POC: NONE SEEN

## 2018-12-30 LAB — URINALYSIS, ROUTINE W REFLEX MICROSCOPIC
Bilirubin Urine: NEGATIVE
Glucose, UA: 50 mg/dL — AB
Ketones, ur: NEGATIVE mg/dL
Nitrite: NEGATIVE
Protein, ur: NEGATIVE mg/dL
Specific Gravity, Urine: 1.02 (ref 1.005–1.030)
pH: 6 (ref 5.0–8.0)

## 2018-12-30 MED ORDER — METRONIDAZOLE 500 MG PO TABS: 500.0000 mg | ORAL_TABLET | Freq: Two times a day (BID) | ORAL | 0 refills | Status: DC

## 2018-12-30 MED ORDER — TERCONAZOLE 0.8 % VA CREA: 1.0000 | TOPICAL_CREAM | Freq: Every day | VAGINAL | 0 refills | Status: DC

## 2018-12-30 NOTE — Telephone Encounter (Signed)
Called and informed pt of appts scheduled on 01/07/2019 for 2hr glucose test and [place] at [facility].  Also, pt is scheduled for New OB appt on 01/28/2019 at 2:00pm.  Pt verbalized understanding.

## 2018-12-30 NOTE — Progress Notes (Signed)
    Virtual Visit via Telephone Note  I connected with Mathis Dad on 12/30/18 at  1:30 PM EDT by telephone and verified that I am speaking with the correct person using two identifiers.   I discussed the limitations, risks, security and privacy concerns of performing an evaluation and management service by telephone and the availability of in person appointments. I also discussed with the patient that there may be a patient responsible charge related to this service. The patient expressed understanding and agreed to proceed.   History of Present Illness: PRENATAL INTAKE SUMMARY  Ms. Yalda presents today New OB Nurse Interview.  OB History    Gravida  8   Para  6   Term  5   Preterm  1   AB  1   Living  6     SAB  1   TAB      Ectopic      Multiple      Live Births  6        Obstetric Comments  [id number] wks per pt       I have reviewed the patient's medical, obstetrical, social, and family histories, medications, and available lab results.  SUBJECTIVE She has no unusual complaints and complains of abdominal pressure, vaginal itching,swelling and irritation and swelling of ankles/feet.  C-section x 1 with first pregnancy, then VBAC x 6. Pt has history of preterm delivery/c-section due to increased blood pressure. Patient also reported she had surgery after third delivery to remove the placenta. Patient reported missing a few doses of Procardia. She is not taking iron pills at this time.    Observations/Objective: Initial nurse interview for history (New OB).  LATE to CARE.  EDD: 04/05/2019 GA:[redacted]w[redacted]d [id number] FHT: not assessed due to non-face to face interview  GENERAL APPEARANCE:  Non-face to face interview.  Assessment and Plan: High risk pregnancy [facility]renatal care-[facility] and physical exam at next visit with Midwife. Patient advised to go to MAU for further evaluation for vaginal itching, irrigations and swelling x 2 days and swelling  of feet/ankles. No providers in office today. Patient is not taking PNV at this time. Patient desires BTL, would like to discuss further with provider.  Follow Up Instructions:   I discussed the assessment and treatment plan with the patient. The patient was provided an opportunity to ask questions and all were answered. The patient agreed with the plan and demonstrated an understanding of the instructions.   The patient was advised to call back or seek an in-person evaluation if the symptoms worsen or if the condition fails to improve as anticipated.  I provided 20 minutes of non-face-to-face time during this encounter.   Clovis Pu, RN

## 2018-12-30 NOTE — Discharge Instructions (Signed)
Bacterial Vaginosis    Bacterial vaginosis is a vaginal infection that occurs when the normal balance of bacteria in the vagina is disrupted. It results from an overgrowth of certain bacteria. This is the most common vaginal infection among women ages 15-44.  Because bacterial vaginosis increases your risk for STIs (sexually transmitted infections), getting treated can help reduce your risk for chlamydia, gonorrhea, herpes, and HIV (human immunodeficiency virus). Treatment is also important for preventing complications in pregnant women, because this condition can cause an early (premature) delivery.  What are the causes?  This condition is caused by an increase in harmful bacteria that are normally present in small amounts in the vagina. However, the reason that the condition develops is not fully understood.  What increases the risk?  The following factors may make you more likely to develop this condition:  · Having a new sexual partner or multiple sexual partners.  · Having unprotected sex.  · Douching.  · Having an intrauterine device (IUD).  · Smoking.  · Drug and alcohol abuse.  · Taking certain antibiotic medicines.  · Being pregnant.  You cannot get bacterial vaginosis from toilet seats, bedding, swimming pools, or contact with objects around you.  What are the signs or symptoms?  Symptoms of this condition include:  · Grey or white vaginal discharge. The discharge can also be watery or foamy.  · A fish-like odor with discharge, especially after sexual intercourse or during menstruation.  · Itching in and around the vagina.  · Burning or pain with urination.  Some women with bacterial vaginosis have no signs or symptoms.  How is this diagnosed?  This condition is diagnosed based on:  · Your medical history.  · A physical exam of the vagina.  · Testing a sample of vaginal fluid under a microscope to look for a large amount of bad bacteria or abnormal cells. Your health care provider may use a cotton swab or  a small wooden spatula to collect the sample.  How is this treated?  This condition is treated with antibiotics. These may be given as a pill, a vaginal cream, or a medicine that is put into the vagina (suppository). If the condition comes back after treatment, a second round of antibiotics may be needed.  Follow these instructions at home:  Medicines  · Take over-the-counter and prescription medicines only as told by your health care provider.  · Take or use your antibiotic as told by your health care provider. Do not stop taking or using the antibiotic even if you start to feel better.  General instructions  · If you have a female sexual partner, tell her that you have a vaginal infection. She should see her health care provider and be treated if she has symptoms. If you have a female sexual partner, he does not need treatment.  · During treatment:  ? Avoid sexual activity until you finish treatment.  ? Do not douche.  ? Avoid alcohol as directed by your health care provider.  ? Avoid breastfeeding as directed by your health care provider.  · Drink enough water and fluids to keep your urine clear or pale yellow.  · Keep the area around your vagina and rectum clean.  ? Wash the area daily with warm water.  ? Wipe yourself from front to back after using the toilet.  · Keep all follow-up visits as told by your health care provider. This is important.  How is this prevented?  · Do not   douche.  · Wash the outside of your vagina with warm water only.  · Use protection when having sex. This includes latex condoms and dental dams.  · Limit how many sexual partners you have. To help prevent bacterial vaginosis, it is best to have sex with just one partner (monogamous).  · Make sure you and your sexual partner are tested for STIs.  · Wear cotton or cotton-lined underwear.  · Avoid wearing tight pants and pantyhose, especially during summer.  · Limit the amount of alcohol that you drink.  · Do not use any products that contain  nicotine or tobacco, such as cigarettes and e-cigarettes. If you need help quitting, ask your health care provider.  · Do not use illegal drugs.  Where to find more information  · Centers for Disease Control and Prevention: www.cdc.gov/std  · American Sexual Health Association (ASHA): www.ashastd.org  · U.S. Department of Health and Human Services, Office on Women's Health: www.womenshealth.gov/ or https://www.womenshealth.gov/a-z-topics/bacterial-vaginosis  Contact a health care provider if:  · Your symptoms do not improve, even after treatment.  · You have more discharge or pain when urinating.  · You have a fever.  · You have pain in your abdomen.  · You have pain during sex.  · You have vaginal bleeding between periods.  Summary  · Bacterial vaginosis is a vaginal infection that occurs when the normal balance of bacteria in the vagina is disrupted.  · Because bacterial vaginosis increases your risk for STIs (sexually transmitted infections), getting treated can help reduce your risk for chlamydia, gonorrhea, herpes, and HIV (human immunodeficiency virus). Treatment is also important for preventing complications in pregnant women, because the condition can cause an early (premature) delivery.  · This condition is treated with antibiotic medicines. These may be given as a pill, a vaginal cream, or a medicine that is put into the vagina (suppository).  This information is not intended to replace advice given to you by your health care provider. Make sure you discuss any questions you have with your health care provider.  Document Released: 08/20/2005 Document Revised: 12/24/2016 Document Reviewed: 05/05/2016  Elsevier Interactive Patient Education © 2019 Elsevier Inc.    Vaginal Yeast infection, Adult    Vaginal yeast infection is a condition that causes vaginal discharge as well as soreness, swelling, and redness (inflammation) of the vagina. This is a common condition. Some women get this infection  frequently.  What are the causes?  This condition is caused by a change in the normal balance of the yeast (candida) and bacteria that live in the vagina. This change causes an overgrowth of yeast, which causes the inflammation.  What increases the risk?  The condition is more likely to develop in women who:  · Take antibiotic medicines.  · Have diabetes.  · Take birth control pills.  · Are pregnant.  · Douche often.  · Have a weak body defense system (immune system).  · Have been taking steroid medicines for a long time.  · Frequently wear tight clothing.  What are the signs or symptoms?  Symptoms of this condition include:  · White, thick, creamy vaginal discharge.  · Swelling, itching, redness, and irritation of the vagina. The lips of the vagina (vulva) may be affected as well.  · Pain or a burning feeling while urinating.  · Pain during sex.  How is this diagnosed?  This condition is diagnosed based on:  · Your medical history.  · A physical exam.  · A   pelvic exam. Your health care provider will examine a sample of your vaginal discharge under a microscope. Your health care provider may send this sample for testing to confirm the diagnosis.  How is this treated?  This condition is treated with medicine. Medicines may be over-the-counter or prescription. You may be told to use one or more of the following:  · Medicine that is taken by mouth (orally).  · Medicine that is applied as a cream (topically).  · Medicine that is inserted directly into the vagina (suppository).  Follow these instructions at home:    Lifestyle  · Do not have sex until your health care provider approves. Tell your sex partner that you have a yeast infection. That person should go to his or her health care provider and ask if they should also be treated.  · Do not wear tight clothes, such as pantyhose or tight pants.  · Wear breathable cotton underwear.  General instructions  · Take or apply over-the-counter and prescription medicines only  as told by your health care provider.  · Eat more yogurt. This may help to keep your yeast infection from returning.  · Do not use tampons until your health care provider approves.  · Try taking a sitz bath to help with discomfort. This is a warm water bath that is taken while you are sitting down. The water should only come up to your hips and should cover your buttocks. Do this 3-4 times per day or as told by your health care provider.  · Do not douche.  · If you have diabetes, keep your blood sugar levels under control.  · Keep all follow-up visits as told by your health care provider. This is important.  Contact a health care provider if:  · You have a fever.  · Your symptoms go away and then return.  · Your symptoms do not get better with treatment.  · Your symptoms get worse.  · You have new symptoms.  · You develop blisters in or around your vagina.  · You have blood coming from your vagina and it is not your menstrual period.  · You develop pain in your abdomen.  Summary  · Vaginal yeast infection is a condition that causes discharge as well as soreness, swelling, and redness (inflammation) of the vagina.  · This condition is treated with medicine. Medicines may be over-the-counter or prescription.  · Take or apply over-the-counter and prescription medicines only as told by your health care provider.  · Do not douche. Do not have sex or use tampons until your health care provider approves.  · Contact a health care provider if your symptoms do not get better with treatment or your symptoms go away and then return.  This information is not intended to replace advice given to you by your health care provider. Make sure you discuss any questions you have with your health care provider.  Document Released: 05/30/2005 Document Revised: 01/06/2018 Document Reviewed: 01/06/2018  Elsevier Interactive Patient Education © 2019 [employer]vier Inc.

## 2018-12-30 NOTE — MAU Provider Note (Signed)
Chief Complaint:  Vaginal Itching   First Provider Initiated Contact with Patient 12/30/18 2300     HPI: Sandra Banks is a 38 y.o. [id number] at 7059w2d who presents to maternity admissions reporting vaginal discharge & irritation. Symptoms started 2 days ago. Reports thin white discharge & external irritation/itching. Last had intercourse a week ago. Has not treated symptoms. Denies dysuria, vaginal bleeding, LOF, lesions, or contractions. Had negative GC/CT earlier this month but would like to be retested tonight.  Normal fetal movement.   Past Medical History:  Diagnosis Date  . Anemia   . Complication of anesthesia   . Hidradenitis suppurativa of left axilla   . Hidradenitis suppurativa of right axilla   . Preeclampsia    2005   OB History  Gravida Para Term Preterm AB Living  8 6 5 1 1 6   SAB TAB Ectopic Multiple Live Births  1       6    # Outcome Date GA Lbr Len/2nd Weight Sex Delivery Anes PTL Lv  8 Current           7 SAB 05/2017          6 Term 08/16/15 6158w0d   M Vag-Spont None  LIV  5 Term 02/24/13 6358w0d   F Vag-Spont EPI  LIV  4 Term 07/19/10 3958w0d   F Vag-Spont EPI  LIV  3 Term 03/29/07    M Vag-Spont None  LIV  2 Term 03/05/06 2010w0d   M Vag-Spont EPI  LIV  1 Preterm 07/09/04   1588 g F CS-Unspec   LIV     Complications: Preeclampsia    Obstetric Comments  GA-38-40 wks per pt   Past Surgical History:  Procedure Laterality Date  . CESAREAN SECTION    . WISDOM TOOTH EXTRACTION     Family History  Problem Relation Age of Onset  . Hypertension Mother   . Seizures Mother   . Hypertension Maternal Grandmother    Social History   Tobacco Use  . Smoking status: Current Some Day Smoker    Packs/day: 0.25    Years: 8.00    Pack years: 2.00    Types: Cigarettes  . Smokeless tobacco: Never Used  Substance Use Topics  . Alcohol use: No  . Drug use: No   No Known Allergies No medications prior to admission.    I have reviewed patient's Past Medical Hx,  Surgical Hx, Family Hx, Social Hx, medications and allergies.   ROS:  Review of Systems  Constitutional: Negative.   Gastrointestinal: Negative.   Genitourinary: Positive for vaginal discharge. Negative for dysuria, vaginal bleeding and vaginal pain.    Physical Exam   Patient Vitals for the past 24 hrs:  BP Temp Temp src Pulse Resp Height Weight  12/30/18 2344 126/78 - - - - - -  12/30/18 2138 117/73 98.2 F (36.8 C) Oral 71 20 5\' 10"  (1.778 m) 109.9 kg    Constitutional: Well-developed, well-nourished female in no acute distress.  Cardiovascular: normal rate & rhythm, no murmur Respiratory: normal effort, lung sounds clear throughout GI: Abd soft, non-tender, gravid appropriate for gestational age. Pos BS x 4 MS: Extremities nontender, no edema, normal ROM Neurologic: Alert and oriented x 4.  GU:      Pelvic: bilateral labia & vaginal walls erythematous. Moderate amount of thin yellow foul smelling discharge as well as small amount of clumpy white discharge which is adherent to vaginal walls. No bleeding. Cervix pink/smooth/no friability. Cervix visually  closed.   NST:  Baseline: 140 bpm, Variability: Good {> 6 bpm), Accelerations: Non-reactive but appropriate for gestational age and Decelerations: Absent   Labs: Results for orders placed or performed during the hospital encounter of 12/30/18 (from the past 24 hour(s))  Urinalysis, Routine w reflex microscopic     Status: Abnormal   Collection Time: 12/30/18 10:16 PM  Result Value Ref Range   Color, Urine YELLOW YELLOW   APPearance CLOUDY (A) CLEAR   Specific Gravity, Urine 1.020 1.005 - 1.030   pH 6.0 5.0 - 8.0   Glucose, UA 50 (A) NEGATIVE mg/dL   Hgb urine dipstick SMALL (A) NEGATIVE   Bilirubin Urine NEGATIVE NEGATIVE   Ketones, ur NEGATIVE NEGATIVE mg/dL   Protein, ur NEGATIVE NEGATIVE mg/dL   Nitrite NEGATIVE NEGATIVE   Leukocytes,Ua LARGE (A) NEGATIVE   RBC / HPF 6-10 0 - 5 RBC/hpf   WBC, UA 11-20 0 - 5 WBC/hpf    Bacteria, UA RARE (A) NONE SEEN   Squamous Epithelial / LPF 11-20 0 - 5   Mucus PRESENT   Wet prep, genital     Status: Abnormal   Collection Time: 12/30/18 11:15 PM  Result Value Ref Range   Yeast Wet Prep HPF POC NONE SEEN NONE SEEN   Trich, Wet Prep NONE SEEN NONE SEEN   Clue Cells Wet Prep HPF POC PRESENT (A) NONE SEEN   WBC, Wet Prep HPF POC MANY (A) NONE SEEN   Sperm NONE SEEN     Imaging:  No results found.  MAU Course: Orders Placed This Encounter  Procedures  . Culture, OB Urine  . Wet prep, genital  . Urinalysis, Routine w reflex microscopic  . Discharge patient   Meds ordered this encounter  Medications  . metroNIDAZOLE (FLAGYL) 500 MG tablet    Sig: Take 1 tablet (500 mg total) by mouth 2 (two) times daily.    Dispense:  14 tablet    Refill:  0    Order Specific Question:   Supervising Provider    Answer:   Levie Heritage [[id number]]  . terconazole (TERAZOL 3) 0.8 % vaginal cream    Sig: Place 1 applicator vaginally at bedtime.    Dispense:  20 g    Refill:  0    Order Specific Question:   Supervising Provider    Answer:   Levie Heritage [[id number]]    MDM: GC/CT & wet prep collected Wet prep + clue cells. Will tx for BV. Will also treat for yeast based on exam & symptoms.   Assessment: 1. BV (bacterial vaginosis)   2. [redacted] weeks gestation of pregnancy     Plan: Discharge home in stable condition.  GC/CT pending Rx flagyl & terazol Discussed reasons to return to MAU   Allergies as of 12/30/2018   No Known Allergies     Medication List    TAKE these medications   ferrous sulfate 325 (65 FE) MG tablet Take 1 tablet (325 mg total) by mouth daily.   metroNIDAZOLE 500 MG tablet Commonly known as:  FLAGYL Take 1 tablet (500 mg total) by mouth 2 (two) times daily.   NIFEdipine 10 MG capsule Commonly known as:  PROCARDIA Take 1 capsule (10 mg total) by mouth every 6 (six) hours as needed (for greater than 5 contractions per hour).   ondansetron 4  MG disintegrating tablet Commonly known as:  Zofran ODT Take 1 tablet (4 mg total) by mouth every 6 (six) hours as needed  for nausea.   terconazole 0.8 % vaginal cream Commonly known as:  Terazol 3 Place 1 applicator vaginally at bedtime.       Judeth Horn, NP 12/31/2018 1:35 AM

## 2018-12-30 NOTE — MAU Note (Signed)
PT SAYS HER VAG IS MOIST- SO SHE USED FEMININE WIPES -  Sunday  NIGHT .  FEELS ITCHING  AND SWELLING - IN VAG .  AND ANKLES ARE SWOLLEN . [facility]

## 2018-12-31 LAB — GC/CHLAMYDIA PROBE AMP (~~LOC~~) NOT AT ARMC
Chlamydia: NEGATIVE
Neisseria Gonorrhea: NEGATIVE

## 2019-01-01 ENCOUNTER — Ambulatory Visit: Payer: Self-pay | Admitting: Primary Care

## 2019-01-02 ENCOUNTER — Encounter: Payer: Self-pay | Admitting: Student

## 2019-01-02 ENCOUNTER — Other Ambulatory Visit: Payer: Self-pay | Admitting: Student

## 2019-01-02 DIAGNOSIS — B373 Candidiasis of vulva and vagina: Secondary | ICD-10-CM

## 2019-01-02 DIAGNOSIS — R8271 Bacteriuria: Secondary | ICD-10-CM | POA: Insufficient documentation

## 2019-01-02 DIAGNOSIS — B3731 Acute candidiasis of vulva and vagina: Secondary | ICD-10-CM

## 2019-01-02 LAB — CULTURE, OB URINE: Culture: 5000 — AB

## 2019-01-02 MED ORDER — TERCONAZOLE 0.4 % VA CREA: 1.0000 | TOPICAL_CREAM | Freq: Every day | VAGINAL | 0 refills | Status: DC

## 2019-01-02 NOTE — Progress Notes (Signed)
Micro lab called to report +GBS.  Notified Venia Carbon, NP of result.  No orders at this time.

## 2019-01-07 ENCOUNTER — Encounter: Payer: Self-pay | Admitting: General Practice

## 2019-01-07 ENCOUNTER — Other Ambulatory Visit: Payer: Self-pay

## 2019-01-07 ENCOUNTER — Ambulatory Visit (HOSPITAL_COMMUNITY)
Admission: RE | Admit: 2019-01-07 | Discharge: 2019-01-07 | Disposition: A | Discharge status: Home / Self Care | Payer: Medicaid Other | Source: Ambulatory Visit | Attending: Obstetrics and Gynecology | Admitting: Obstetrics and Gynecology

## 2019-01-07 ENCOUNTER — Other Ambulatory Visit (INDEPENDENT_AMBULATORY_CARE_PROVIDER_SITE_OTHER): Payer: Medicaid Other | Admitting: *Deleted

## 2019-01-07 DIAGNOSIS — O0990 Supervision of high risk pregnancy, unspecified, unspecified trimester: Secondary | ICD-10-CM

## 2019-01-07 DIAGNOSIS — Z23 Encounter for immunization: Secondary | ICD-10-CM | POA: Diagnosis not present

## 2019-01-07 DIAGNOSIS — Z3A27 27 weeks gestation of pregnancy: Secondary | ICD-10-CM | POA: Diagnosis not present

## 2019-01-07 DIAGNOSIS — Z363 Encounter for antenatal screening for malformations: Secondary | ICD-10-CM

## 2019-01-07 DIAGNOSIS — O09299 Supervision of pregnancy with other poor reproductive or obstetric history, unspecified trimester: Secondary | ICD-10-CM | POA: Insufficient documentation

## 2019-01-07 DIAGNOSIS — Z3480 Encounter for supervision of other normal pregnancy, unspecified trimester: Secondary | ICD-10-CM

## 2019-01-07 LAB — OB RESULTS CONSOLE GBS: GBS: POSITIVE

## 2019-01-07 NOTE — Progress Notes (Signed)
   Patient in clinic for prenatal labs and 28 week lab work. Patient is not taking PNV and Procardia as needed.  Clovis Pu, RN

## 2019-01-07 NOTE — Patient Instructions (Addendum)
Fetal Movement Counts °Patient Name: ________________________________________________ Patient Due Date: ____________________ °What is a fetal movement count? ° °A fetal movement count is the number of times that you feel your baby move during a certain amount of time. This may also be called a fetal kick count. A fetal movement count is recommended for every pregnant woman. You may be asked to start counting fetal movements as early as week 28 of your pregnancy. °Pay attention to when your baby is most active. You may notice your baby's sleep and wake cycles. You may also notice things that make your baby move more. You should do a fetal movement count: °· When your baby is normally most active. °· At the same time each day. °A good time to count movements is while you are resting, after having something to eat and drink. °How do I count fetal movements? °1. Find a quiet, comfortable area. Sit, or lie down on your side. °2. Write down the date, the start time and stop time, and the number of movements that you felt between those two times. Take this information with you to your health care visits. °3. For 2 hours, count kicks, flutters, swishes, rolls, and jabs. You should feel at least 10 movements during 2 hours. °4. You may stop counting after you have felt 10 movements. °5. If you do not feel 10 movements in 2 hours, have something to eat and drink. Then, keep resting and counting for 1 hour. If you feel at least 4 movements during that hour, you may stop counting. °Contact a health care provider if: °· You feel fewer than 4 movements in 2 hours. °· Your baby is not moving like he or she usually does. °Date: ____________ Start time: ____________ Stop time: ____________ Movements: ____________ °Date: ____________ Start time: ____________ Stop time: ____________ Movements: ____________ °Date: ____________ Start time: ____________ Stop time: ____________ Movements: ____________ °Date: ____________ Start time:  ____________ Stop time: ____________ Movements: ____________ °Date: ____________ Start time: ____________ Stop time: ____________ Movements: ____________ °Date: ____________ Start time: ____________ Stop time: ____________ Movements: ____________ °Date: ____________ Start time: ____________ Stop time: ____________ Movements: ____________ °Date: ____________ Start time: ____________ Stop time: ____________ Movements: ____________ °Date: ____________ Start time: ____________ Stop time: ____________ Movements: ____________ °This information is not intended to replace advice given to you by your health care provider. Make sure you discuss any questions you have with your health care provider. °Document Released: 09/19/2006 Document Revised: 04/18/2016 Document Reviewed: 09/29/2015 °Elsevier Interactive Patient Education © 2019 [employer] Inc[employer] °Warning Signs During Pregnancy °A pregnancy lasts about 40 weeks, starting from the first day of your last period until the baby is born. Pregnancy is divided into three phases called trimesters. °· The first trimester refers to week 1 through week 13 of pregnancy. °· The second trimester is the start of week 14 through the end of week 27. °· The third trimester is the start of week 28 until you deliver your baby. °During each trimester of pregnancy, certain signs and symptoms may indicate a problem. Talk with your health care provider about your current health and any medical conditions you have. Make sure you know the symptoms that you should watch for and report. °How does this affect me? ° °Warning signs in the first trimester °While some changes during the first trimester may be uncomfortable, most do not represent a serious problem. Let your health care provider know if you have any of the following warning signs in the first trimester: °· You cannot   eat or drink without vomiting, and this lasts for longer than a day. °· You have vaginal bleeding or spotting along with  menstrual-like cramping. °· You have diarrhea for longer than a day. °· You have a fever or other signs of infection, such as: °? Pain or burning when you urinate. °? Foul smelling or thick or yellowish vaginal discharge. °Warning signs in the second trimester °As your baby grows and changes during the second trimester, there are additional signs and symptoms that may indicate a problem. These include: °· Signs and symptoms of infection, including a fever. °· Signs or symptoms of a miscarriage or preterm labor, such as regular contractions, menstrual-like cramping, or lower abdominal pain. °· Bloody or watery vaginal discharge or obvious vaginal bleeding. °· Feeling like your heart is pounding. °· Having trouble breathing. °· Nausea, vomiting, or diarrhea that lasts for longer than a day. °· Craving non-food items, such as clay, chalk, or dirt. This may be a sign of a very treatable medical condition called pica. °Later in your second trimester, watch for signs and symptoms of a serious medical condition called preeclampsia.These include: °· Changes in your vision. °· A severe headache that does not go away. °· Nausea and vomiting. °It is also important to notice if your baby stops moving or moves less than usual during this time. °Warning signs in the third trimester °As you approach the third trimester, your baby is growing and your body is preparing for the birth of your baby. In your third trimester, be sure to let your health care provider know if: °· You have signs and symptoms of infection, including a fever. °· You have vaginal bleeding. °· You notice that your baby is moving less than usual or is not moving. °· You have nausea, vomiting, or diarrhea that lasts for longer than a day. °· You have a severe headache that does not go away. °· You have vision changes, including seeing spots or having blurry or double vision. °· You have increased swelling in your hands or face. °How does this affect my  baby? °Throughout your pregnancy, always report any of the warning signs of a problem to your health care provider. This can help prevent complications that may affect your baby, including: °· Increased risk for premature birth. °· Infection that may be transmitted to your baby. °· Increased risk for stillbirth. °Contact a health care provider if: °· You have any of the warning signs of a problem for the current trimester of your pregnancy. °· Any of the following apply to you during any trimester of pregnancy: °? You have strong emotions, such as sadness or anxiety, that interfere with work or personal relationships. °? You feel unsafe in your home and need help finding a safe place to live. °? You are using tobacco products, alcohol, or drugs and you need help to stop. °Get help right away if: °You have signs or symptoms of labor before 37 weeks of pregnancy. These include: °· Contractions that are 5 minutes or less apart, or that increase in frequency, intensity, or length. °· Sudden, sharp abdominal pain or low back pain. °· Uncontrolled gush or trickle of fluid from your vagina. °Summary °· A pregnancy lasts about 40 weeks, starting from the first day of your last period until the baby is born. Pregnancy is divided into three phases called trimesters. Each trimester has warning signs to watch for. °· Always report any warning signs to your health care provider in order to   prevent complications that may affect both you and your baby. °· Talk with your health care provider about your current health and any medical conditions you have. Make sure you know the symptoms that you should watch for and report. °This information is not intended to replace advice given to you by your health care provider. Make sure you discuss any questions you have with your health care provider. °Document Released: 06/06/2017 Document Revised: 06/06/2017 Document Reviewed: 06/06/2017 °Elsevier Interactive Patient Education © 2019  Elsevier Inc. ° °

## 2019-01-08 ENCOUNTER — Other Ambulatory Visit (HOSPITAL_COMMUNITY): Payer: Self-pay | Admitting: *Deleted

## 2019-01-08 DIAGNOSIS — Z362 Encounter for other antenatal screening follow-up: Secondary | ICD-10-CM

## 2019-01-08 LAB — COMPREHENSIVE METABOLIC PANEL
ALT: 6 IU/L (ref 0–32)
AST: 9 IU/L (ref 0–40)
Albumin/Globulin Ratio: 1.2 (ref 1.2–2.2)
Albumin: 3.3 g/dL — ABNORMAL LOW (ref 3.8–4.8)
Alkaline Phosphatase: 78 IU/L (ref 39–117)
BUN/Creatinine Ratio: 12 (ref 9–23)
BUN: 6 mg/dL (ref 6–20)
Bilirubin Total: <0.2 mg/dL (ref 0.0–1.2)
CO2: 19 mmol/L — ABNORMAL LOW (ref 20–29)
Calcium: 8.1 mg/dL — ABNORMAL LOW (ref 8.7–10.2)
Chloride: 106 mmol/L (ref 96–106)
Creatinine, Ser: 0.51 mg/dL — ABNORMAL LOW (ref 0.57–1.00)
GFR calc Af Amer: 144 mL/min/{1.73_m2} (ref >59)
GFR calc non Af Amer: 125 mL/min/{1.73_m2} (ref >59)
Globulin, Total: 2.7 g/dL (ref 1.5–4.5)
Glucose: 81 mg/dL (ref 65–99)
Potassium: 3.8 mmol/L (ref 3.5–5.2)
Sodium: 135 mmol/L (ref 134–144)
Total Protein: 6 g/dL (ref 6.0–8.5)

## 2019-01-08 LAB — PROTEIN / CREATININE RATIO, URINE
Creatinine, Urine: 118 mg/dL
Protein, Ur: 15.8 mg/dL
Protein/Creat Ratio: 134 mg/g creat (ref 0–200)

## 2019-01-09 ENCOUNTER — Telehealth: Payer: Self-pay | Admitting: *Deleted

## 2019-01-09 NOTE — Telephone Encounter (Signed)
Left voice message for patient that she has been scheduled for Iron Infusion x 2. First dose is scheduled for May 13 th at noon at [facility]. Will send Mychart message.  Clovis Pu, RN

## 2019-01-09 NOTE — Addendum Note (Signed)
Addended by: Clovis Pu on: 01/09/2019 08:36 AM   Modules accepted: Orders, SmartSet

## 2019-01-09 NOTE — Telephone Encounter (Signed)
-----   Message from Raelyn Mora, [place] sent at 01/08/2019 10:19 PM EDT ----- Needs Feraheme infusion x 2

## 2019-01-11 LAB — URINE CULTURE, OB REFLEX

## 2019-01-11 LAB — CULTURE, OB URINE

## 2019-01-13 LAB — OBSTETRIC PANEL, INCLUDING HIV
Antibody Screen: NEGATIVE
Basophils Absolute: 0 10*3/uL (ref 0.0–0.2)
Basos: 0 %
EOS (ABSOLUTE): 0.1 10*3/uL (ref 0.0–0.4)
Eos: 1 %
HIV Screen 4th Generation wRfx: NONREACTIVE
Hematocrit: 28.6 % — ABNORMAL LOW (ref 34.0–46.6)
Hemoglobin: 8.9 g/dL — ABNORMAL LOW (ref 11.1–15.9)
Hepatitis B Surface Ag: NEGATIVE
Immature Grans (Abs): 0 10*3/uL (ref 0.0–0.1)
Immature Granulocytes: 0 %
Lymphocytes Absolute: 2.4 10*3/uL (ref 0.7–3.1)
Lymphs: 50 %
MCH: 24 pg — ABNORMAL LOW (ref 26.6–33.0)
MCHC: 31.1 g/dL — ABNORMAL LOW (ref 31.5–35.7)
MCV: 77 fL — ABNORMAL LOW (ref 79–97)
Monocytes Absolute: 0.3 10*3/uL (ref 0.1–0.9)
Monocytes: 7 %
Neutrophils Absolute: 2 10*3/uL (ref 1.4–7.0)
Neutrophils: 42 %
Platelets: 213 10*3/uL (ref 150–450)
RBC: 3.71 x10E6/uL — ABNORMAL LOW (ref 3.77–5.28)
RDW: 14.8 % (ref 11.7–15.4)
RPR Ser Ql: NONREACTIVE
Rh Factor: POSITIVE
Rubella Antibodies, IGG: 1.49 index (ref >0.99)
WBC: 4.8 10*3/uL (ref 3.4–10.8)

## 2019-01-13 LAB — GLUCOSE TOLERANCE, 2 HOURS W/ 1HR
Glucose, 1 hour: 119 mg/dL (ref 65–179)
Glucose, 2 hour: 93 mg/dL (ref 65–152)
Glucose, Fasting: 77 mg/dL (ref 65–91)

## 2019-01-13 NOTE — Discharge Instructions (Signed)

## 2019-01-14 ENCOUNTER — Inpatient Hospital Stay (HOSPITAL_COMMUNITY)
Admission: RE | Admit: 2019-01-14 | Discharge: 2019-01-14 | Disposition: A | Discharge status: Home / Self Care | Payer: Medicaid Other | Source: Ambulatory Visit | Attending: Obstetrics and Gynecology | Admitting: Obstetrics and Gynecology

## 2019-01-14 ENCOUNTER — Encounter (HOSPITAL_COMMUNITY): Payer: Self-pay

## 2019-01-19 ENCOUNTER — Encounter: Payer: Self-pay | Admitting: General Practice

## 2019-01-20 ENCOUNTER — Inpatient Hospital Stay (HOSPITAL_COMMUNITY): Admission: RE | Admit: 2019-01-20 | Payer: Medicaid Other | Source: Ambulatory Visit

## 2019-01-21 ENCOUNTER — Encounter (HOSPITAL_COMMUNITY): Payer: Medicaid Other

## 2019-01-27 ENCOUNTER — Encounter (HOSPITAL_COMMUNITY): Payer: Medicaid Other

## 2019-01-28 ENCOUNTER — Encounter: Payer: Self-pay | Admitting: General Practice

## 2019-01-28 ENCOUNTER — Ambulatory Visit (INDEPENDENT_AMBULATORY_CARE_PROVIDER_SITE_OTHER): Payer: Medicaid Other | Admitting: Certified Nurse Midwife

## 2019-01-28 ENCOUNTER — Encounter: Payer: Self-pay | Admitting: Certified Nurse Midwife

## 2019-01-28 ENCOUNTER — Other Ambulatory Visit: Payer: Self-pay

## 2019-01-28 VITALS — BP 129/76 | HR 94 | Temp 98.4°F | Wt 242.0 lb

## 2019-01-28 DIAGNOSIS — O0933 Supervision of pregnancy with insufficient antenatal care, third trimester: Secondary | ICD-10-CM | POA: Diagnosis not present

## 2019-01-28 DIAGNOSIS — O09293 Supervision of pregnancy with other poor reproductive or obstetric history, third trimester: Secondary | ICD-10-CM | POA: Diagnosis not present

## 2019-01-28 DIAGNOSIS — O34219 Maternal care for unspecified type scar from previous cesarean delivery: Secondary | ICD-10-CM

## 2019-01-28 DIAGNOSIS — O09213 Supervision of pregnancy with history of pre-term labor, third trimester: Secondary | ICD-10-CM

## 2019-01-28 DIAGNOSIS — O0993 Supervision of high risk pregnancy, unspecified, third trimester: Secondary | ICD-10-CM

## 2019-01-28 DIAGNOSIS — Z3A30 30 weeks gestation of pregnancy: Secondary | ICD-10-CM

## 2019-01-28 DIAGNOSIS — O09299 Supervision of pregnancy with other poor reproductive or obstetric history, unspecified trimester: Secondary | ICD-10-CM

## 2019-01-28 DIAGNOSIS — R8271 Bacteriuria: Secondary | ICD-10-CM

## 2019-01-28 DIAGNOSIS — O09899 Supervision of other high risk pregnancies, unspecified trimester: Secondary | ICD-10-CM

## 2019-01-28 DIAGNOSIS — O0990 Supervision of high risk pregnancy, unspecified, unspecified trimester: Secondary | ICD-10-CM

## 2019-01-28 MED ORDER — AMOXICILLIN 500 MG PO CAPS: 500.0000 mg | ORAL_CAPSULE | Freq: Three times a day (TID) | ORAL | 0 refills | Status: AC

## 2019-01-28 MED ORDER — BLOOD PRESSURE MONITOR AUTOMAT DEVI: 0 refills | Status: AC

## 2019-01-28 NOTE — Progress Notes (Signed)
History:   Sandra Banks is a 38 y.o. [id number] at [redacted]w[redacted]d by early ultrasound being seen today for her first obstetrical visit.  Her obstetrical history is significant for advanced maternal age, pre-eclampsia and late prenatal care. Patient does intend to breast feed. Pregnancy history fully reviewed.  Patient reports edema.      HISTORY: OB History  Gravida Para Term Preterm AB Living  8 6 5 1 1 6   SAB TAB Ectopic Multiple Live Births  1 0 0 0 6    # Outcome Date GA Lbr Len/2nd Weight Sex Delivery Anes PTL Lv  8 Current           7 SAB 05/2017          6 Term 08/16/15 [redacted]w[redacted]d   M Vag-Spont None  LIV  5 Term 02/24/13 105w0d   F Vag-Spont EPI  LIV  4 Term 07/19/10 [redacted]w[redacted]d   F Vag-Spont EPI  LIV  3 Term 03/29/07    M Vag-Spont None  LIV  2 Term 03/05/06 [redacted]w[redacted]d   M Vag-Spont EPI  LIV  1 Preterm 07/09/04   3 lb 8 oz (1.588 kg) F CS-Unspec   LIV     Complications: Preeclampsia    Obstetric Comments  GA-38-40 wks per pt    Last pap smear was done 2016 and was normal per patient, pap smear deferred today d/t advanced gestational age. Needs Pap PP   Past Medical History:  Diagnosis Date  . Anemia   . Complication of anesthesia   . Hidradenitis suppurativa of left axilla   . Hidradenitis suppurativa of right axilla   . Preeclampsia    2005   Past Surgical History:  Procedure Laterality Date  . CESAREAN SECTION    . WISDOM TOOTH EXTRACTION     Family History  Problem Relation Age of Onset  . Hypertension Mother   . Seizures Mother   . Hypertension Maternal Grandmother    Social History   Tobacco Use  . Smoking status: Current Some Day Smoker    Packs/day: 0.25    Years: 8.00    Pack years: 2.00    Types: Cigarettes  . Smokeless tobacco: Never Used  Substance Use Topics  . Alcohol use: No  . Drug use: No   No Known Allergies Current Outpatient Medications on File Prior to Visit  Medication Sig Dispense Refill  . ferrous sulfate 325 (65 FE) MG tablet Take 1  tablet (325 mg total) by mouth daily. (Patient not taking: Reported on 10/11/2018) 30 tablet 0  . metroNIDAZOLE (FLAGYL) 500 MG tablet Take 1 tablet (500 mg total) by mouth 2 (two) times daily. 14 tablet 0  . NIFEdipine (PROCARDIA) 10 MG capsule Take 1 capsule (10 mg total) by mouth every 6 (six) hours as needed (for greater than 5 contractions per hour). 30 capsule 0  . ondansetron (ZOFRAN ODT) 4 MG disintegrating tablet Take 1 tablet (4 mg total) by mouth every 6 (six) hours as needed for nausea. 20 tablet 0  . terconazole (TERAZOL 3) 0.8 % vaginal cream Place 1 applicator vaginally at bedtime. 20 g 0  . terconazole (TERAZOL 7) 0.4 % vaginal cream Place 1 applicator vaginally at bedtime. Use for seven days 45 g 0   No current facility-administered medications on file prior to visit.     Review of Systems Pertinent items noted in HPI and remainder of comprehensive ROS otherwise negative. Physical Exam:   Vitals:   01/28/19 1417  BP:  129/76  Pulse: 94  Temp: 98.4 F (36.9 C)  Weight: 242 lb (109.8 kg)   Fetal Heart Rate (bpm): 145 Uterus:  Fundal Height: 35 cm  System: General: well-developed, well-nourished female in no acute distress   Breasts:  normal appearance, no masses or tenderness bilaterally   Skin: normal coloration and turgor, no rashes   Neurologic: oriented, normal, negative, normal mood   Extremities: normal strength, tone, and muscle mass, ROM of all joints is normal   HEENT PERRLA, extraocular movement intact and sclera clear   Mouth/Teeth mucous membranes moist, pharynx normal without lesions and dental hygiene good   Neck supple and no masses   Cardiovascular: regular rate and rhythm   Respiratory:  no respiratory distress, normal breath sounds   Abdomen: Soft,gravid appropriate for gestational age, FH 4635   Assessment:    Pregnancy: [id number] Patient Active Problem List   Diagnosis Date Noted  . Late prenatal care in third trimester 01/28/2019  . GBS  bacteriuria 01/02/2019  . Supervision of high risk pregnancy, antepartum 12/30/2018  . History of cesarean delivery, currently pregnant 12/30/2018  . History of pre-eclampsia in prior pregnancy, currently pregnant, unspecified trimester 12/30/2018  . History of preterm delivery, currently pregnant, unspecified trimester 12/30/2018     Plan:    1. Late prenatal care in third trimester - Care initiated at 2916w3d   2. Supervision of high risk pregnancy, antepartum - Reviewed labs and results from intake visit, GTT normal  - Anticipatory guidance on upcoming appointments  - +2 pitting edema in ankles, discussed increasing water intake, wearing compression socks at work and elevating legs to level of chest  - Patient has iron deficiency anemia and has missed feraheme infusion, educated and discussed importance of infusions since supplements not resolving IDA. Educated on continued decreased Hgb and blood loss after delivery might make need for blood transfusion, patient verbalizes understanding and agrees to go to feraheme infusion  - Patient scheduled for infusion - Educated on birth control options after delivery, patient does not want BTL wants one more baby, plans Nexplanon prior to discharge home .  - Blood Pressure Monitoring (BLOOD PRESSURE MONITOR AUTOMAT) DEVI; Appropriate size cuff (forearm circumference) with automatic BP monitor. To be monitored regularly at home. ICD-10 code: 2O09.90, Supervision of high risk pregnancy.  Dispense: 1 Device; Refill: 0  3. GBS bacteriuria - Rx for treatment sent to pharmacy  - amoxicillin (AMOXIL) 500 MG capsule; Take 1 capsule (500 mg total) by mouth 3 (three) times daily for 7 days.  Dispense: 21 capsule; Refill: 0  4. History of cesarean delivery, currently pregnant - Hx of C/S x1 with 6 successful VBAC, needs consent signed   5. History of pre-eclampsia in prior pregnancy, currently pregnant, unspecified trimester - BP stable at this time  -  Preeclampsia precautions reviewed   6. History of preterm delivery, currently pregnant, unspecified trimester   Initial labs drawn. Continue prenatal vitamins. Ultrasound discussed; fetal anatomic survey: results reviewed. Problem list reviewed and updated. The nature of [place] [facility] with multiple MDs and other Advanced Practice Providers was explained to patient; also emphasized that residents, students are part of our team. Routine obstetric precautions reviewed. Return in about 3 weeks (around 02/18/2019) for ROB-mychart visit .     Sharyon CableVeronica C Jarica Plass, CNM [facility]

## 2019-01-28 NOTE — Patient Instructions (Signed)

## 2019-02-02 NOTE — Telephone Encounter (Signed)
Left voice message informing patient of Iron infusion appointment. Appointment scheduled for tomorrow 02/03/2019 at noon. If patient does not show or call, the appointment will not be rescheduled.   Clovis Pu, RN

## 2019-02-03 ENCOUNTER — Inpatient Hospital Stay (HOSPITAL_COMMUNITY): Admission: RE | Admit: 2019-02-03 | Payer: Medicaid Other | Source: Ambulatory Visit

## 2019-02-04 ENCOUNTER — Ambulatory Visit (HOSPITAL_COMMUNITY): Admission: RE | Admit: 2019-02-04 | Payer: Medicaid Other | Source: Ambulatory Visit

## 2019-02-04 ENCOUNTER — Encounter (HOSPITAL_COMMUNITY): Payer: Self-pay

## 2019-02-07 DIAGNOSIS — O0990 Supervision of high risk pregnancy, unspecified, unspecified trimester: Secondary | ICD-10-CM | POA: Diagnosis not present

## 2019-02-10 ENCOUNTER — Inpatient Hospital Stay (HOSPITAL_COMMUNITY)
Admission: AD | Admit: 2019-02-10 | Discharge: 2019-02-10 | Disposition: A | Discharge status: Home / Self Care | Payer: Medicaid Other | Attending: Obstetrics and Gynecology | Admitting: Obstetrics and Gynecology

## 2019-02-10 ENCOUNTER — Encounter (HOSPITAL_COMMUNITY): Payer: Self-pay

## 2019-02-10 ENCOUNTER — Other Ambulatory Visit: Payer: Self-pay

## 2019-02-10 DIAGNOSIS — Z3A32 32 weeks gestation of pregnancy: Secondary | ICD-10-CM | POA: Insufficient documentation

## 2019-02-10 DIAGNOSIS — O0993 Supervision of high risk pregnancy, unspecified, third trimester: Secondary | ICD-10-CM

## 2019-02-10 DIAGNOSIS — Z3689 Encounter for other specified antenatal screening: Secondary | ICD-10-CM

## 2019-02-10 DIAGNOSIS — R51 Headache: Secondary | ICD-10-CM | POA: Insufficient documentation

## 2019-02-10 DIAGNOSIS — O0990 Supervision of high risk pregnancy, unspecified, unspecified trimester: Secondary | ICD-10-CM

## 2019-02-10 DIAGNOSIS — O26893 Other specified pregnancy related conditions, third trimester: Secondary | ICD-10-CM | POA: Diagnosis not present

## 2019-02-10 DIAGNOSIS — Z87891 Personal history of nicotine dependence: Secondary | ICD-10-CM | POA: Diagnosis not present

## 2019-02-10 DIAGNOSIS — R1011 Right upper quadrant pain: Secondary | ICD-10-CM | POA: Diagnosis not present

## 2019-02-10 LAB — COMPREHENSIVE METABOLIC PANEL
ALT: 8 U/L (ref 0–44)
AST: 12 U/L — ABNORMAL LOW (ref 15–41)
Albumin: 2.4 g/dL — ABNORMAL LOW (ref 3.5–5.0)
Alkaline Phosphatase: 93 U/L (ref 38–126)
Anion gap: 7 (ref 5–15)
BUN: <5 mg/dL — ABNORMAL LOW (ref 6–20)
CO2: 22 mmol/L (ref 22–32)
Calcium: 8.2 mg/dL — ABNORMAL LOW (ref 8.9–10.3)
Chloride: 107 mmol/L (ref 98–111)
Creatinine, Ser: 0.46 mg/dL (ref 0.44–1.00)
GFR calc Af Amer: >60 mL/min (ref >60)
GFR calc non Af Amer: >60 mL/min (ref >60)
Glucose, Bld: 130 mg/dL — ABNORMAL HIGH (ref 70–99)
Potassium: 3.3 mmol/L — ABNORMAL LOW (ref 3.5–5.1)
Sodium: 136 mmol/L (ref 135–145)
Total Bilirubin: 0.3 mg/dL (ref 0.3–1.2)
Total Protein: 5.8 g/dL — ABNORMAL LOW (ref 6.5–8.1)

## 2019-02-10 LAB — RAPID URINE DRUG SCREEN, HOSP PERFORMED
Amphetamines: NOT DETECTED
Barbiturates: NOT DETECTED
Benzodiazepines: NOT DETECTED
Cocaine: NOT DETECTED
Opiates: POSITIVE — AB
Tetrahydrocannabinol: NOT DETECTED

## 2019-02-10 LAB — CBC WITH DIFFERENTIAL/PLATELET
Abs Immature Granulocytes: 0.01 10*3/uL (ref 0.00–0.07)
Basophils Absolute: 0 10*3/uL (ref 0.0–0.1)
Basophils Relative: 0 %
Eosinophils Absolute: 0 10*3/uL (ref 0.0–0.5)
Eosinophils Relative: 1 %
HCT: 27.3 % — ABNORMAL LOW (ref 36.0–46.0)
Hemoglobin: 8.5 g/dL — ABNORMAL LOW (ref 12.0–15.0)
Immature Granulocytes: 0 %
Lymphocytes Relative: 35 %
Lymphs Abs: 1.8 10*3/uL (ref 0.7–4.0)
MCH: 23.6 pg — ABNORMAL LOW (ref 26.0–34.0)
MCHC: 31.1 g/dL (ref 30.0–36.0)
MCV: 75.8 fL — ABNORMAL LOW (ref 80.0–100.0)
Monocytes Absolute: 0.2 10*3/uL (ref 0.1–1.0)
Monocytes Relative: 4 %
Neutro Abs: 3.1 10*3/uL (ref 1.7–7.7)
Neutrophils Relative %: 60 %
Platelets: 202 10*3/uL (ref 150–400)
RBC: 3.6 MIL/uL — ABNORMAL LOW (ref 3.87–5.11)
RDW: 15.9 % — ABNORMAL HIGH (ref 11.5–15.5)
WBC: 5.2 10*3/uL (ref 4.0–10.5)
nRBC: 0 % (ref 0.0–0.2)

## 2019-02-10 LAB — PROTEIN / CREATININE RATIO, URINE
Creatinine, Urine: 82.37 mg/dL
Protein Creatinine Ratio: 0.08 mg/mg{Cre} (ref 0.00–0.15)
Total Protein, Urine: 7 mg/dL

## 2019-02-10 MED ORDER — PROCHLORPERAZINE EDISYLATE 10 MG/2ML IJ SOLN
10.0000 mg | Freq: Once | INTRAMUSCULAR | Status: AC
  Administered 2019-02-10: 10 mg via INTRAVENOUS
  Filled 2019-02-10: qty 2

## 2019-02-10 MED ORDER — LACTATED RINGERS IV BOLUS: 1000.0000 mL | Freq: Once | INTRAVENOUS | Status: DC

## 2019-02-10 MED ORDER — LACTATED RINGERS IV BOLUS
500.0000 mL | Freq: Once | INTRAVENOUS | Status: AC
  Administered 2019-02-10: 500 mL via INTRAVENOUS

## 2019-02-10 MED ORDER — DIPHENHYDRAMINE HCL 50 MG/ML IJ SOLN
25.0000 mg | Freq: Once | INTRAMUSCULAR | Status: AC
  Administered 2019-02-10: 25 mg via INTRAVENOUS
  Filled 2019-02-10: qty 1

## 2019-02-10 NOTE — MAU Note (Signed)
Don't feel good- don't feel normal, head hurts-took Tylenol, no relief. Having pain in RUQ.all this started today. Legs are real swollen. Been swollen, just getting worse.

## 2019-02-10 NOTE — Discharge Instructions (Signed)

## 2019-02-10 NOTE — Telephone Encounter (Signed)
Received a call from [employer] reporting patient's blood pressure.    Tried to call patient's cell phone twice unable to leave a voice message regarding blood pressure. Reviewed patient's video, patient's ankles and feet are swollen. Will send a Mychart message.  Spoke with patient, advised patient to go to MAU regarding her symptoms. Patient stated she just don't feel right and swollen/painful  Feet/ankles.  Spoke with MAU provider Barnetta Chapel regarding patient.  Derl Barrow, RN

## 2019-02-10 NOTE — MAU Provider Note (Addendum)
History     CSN: [id number]  Arrival date and time: 02/10/19 1746   First Provider Initiated Contact with Patient 02/10/19 1836     Chief Complaint  Patient presents with  . Headache  . Leg Swelling  . Foot Swelling  . Abdominal Pain   HPI Sandra Banks is a 38 y.o. [id number] at 4475w2d who presents to MAU with multiple complaint. She obtained an elevated blood pressure of 126/97 on her home cuff today. She denies vaginal bleeding, leaking of fluid, decreased fetal movement, fever, falls, or recent illness.     Headache This is a new problem, onset this morning. Patient reports she woke up around 10am today with a throbbing pain behind both eyes. She rates her pain as 8/10. She took Tylenol 500mg  about 3 hours ago but did not experience relief. She denies visual disturbances, weakness or syncope.  RUQ pain Patient reports pain in her RUQ, new onset this morning coinciding with her headache. She is unable to provide pain score due to the severity of her headache.  Swollen lower extremities This is a new problem, onset 2-3 days ago. Patient submitted a video via her MyChart account demonstrating the extent of swelling in her lower extremities. She states she has not experienced this much swelling in any of her previous pregnancies. She denies calf pain, discoloration, pain with ambulation.  OB History    Gravida  8   Para  6   Term  5   Preterm  1   AB  1   Living  6     SAB  1   TAB      Ectopic      Multiple      Live Births  6        Obstetric Comments  [id number] wks per pt        Past Medical History:  Diagnosis Date  . Anemia   . Complication of anesthesia   . Hidradenitis suppurativa of left axilla   . Hidradenitis suppurativa of right axilla   . Preeclampsia    2005    Past Surgical History:  Procedure Laterality Date  . CESAREAN SECTION    . WISDOM TOOTH EXTRACTION      Family History  Problem Relation Age of Onset  . Hypertension  Mother   . Seizures Mother   . Hypertension Maternal Grandmother     Social History   Tobacco Use  . Smoking status: Former Smoker    Packs/day: 0.25    Years: 8.00    Pack years: 2.00    Types: Cigarettes  . Smokeless tobacco: Never Used  Substance Use Topics  . Alcohol use: No  . Drug use: No    Allergies: No Known Allergies  Medications Prior to Admission  Medication Sig Dispense Refill Last Dose  . Blood Pressure Monitoring (BLOOD PRESSURE MONITOR AUTOMAT) DEVI Appropriate size cuff (forearm circumference) with automatic BP monitor. To be monitored regularly at home. ICD-10 code: 59O09.90, Supervision of high risk pregnancy. 1 Device 0 02/10/2019 at Unknown time  . ondansetron (ZOFRAN ODT) 4 MG disintegrating tablet Take 1 tablet (4 mg total) by mouth every 6 (six) hours as needed for nausea. 20 tablet 0 Past Month at Unknown time  . ferrous sulfate 325 (65 FE) MG tablet Take 1 tablet (325 mg total) by mouth daily. (Patient not taking: Reported on 10/11/2018) 30 tablet 0 Not Taking  . metroNIDAZOLE (FLAGYL) 500 MG tablet Take 1 tablet (500 mg  total) by mouth 2 (two) times daily. 14 tablet 0   . NIFEdipine (PROCARDIA) 10 MG capsule Take 1 capsule (10 mg total) by mouth every 6 (six) hours as needed (for greater than 5 contractions per hour). 30 capsule 0 Not Taking  . terconazole (TERAZOL 3) 0.8 % vaginal cream Place 1 applicator vaginally at bedtime. 20 g 0   . terconazole (TERAZOL 7) 0.4 % vaginal cream Place 1 applicator vaginally at bedtime. Use for seven days 45 g 0     Review of Systems  Constitutional: Negative for fever.  Eyes: Positive for photophobia. Negative for visual disturbance.  Respiratory: Negative for shortness of breath.   Gastrointestinal: Positive for abdominal pain.  Genitourinary: Negative for difficulty urinating, dysuria, vaginal bleeding, vaginal discharge and vaginal pain.  Musculoskeletal: Negative for back pain, gait problem and joint swelling.   Neurological: Positive for headaches. Negative for syncope and weakness.  All other systems reviewed and are negative.  Physical Exam   Blood pressure 131/79, pulse 81, temperature 98.6 F (37 C), temperature source Oral, resp. rate 12, height 5\' 10"  (1.778 m), weight 110.1 kg, last menstrual period 05/23/2018, SpO2 98 %, unknown if currently breastfeeding.  Physical Exam  Nursing note and vitals reviewed. Constitutional: She is oriented to person, place, and time. She appears well-developed and well-nourished. She appears lethargic.  Cardiovascular: Normal rate.  Respiratory: Effort normal and breath sounds normal. No respiratory distress.  GI: Soft. She exhibits no distension. There is no abdominal tenderness. There is no rebound and no guarding.  Gravid  Genitourinary:    Genitourinary Comments: Deferred due to chief complaint   Neurological: She is oriented to person, place, and time. She has normal strength. She appears lethargic. She displays normal reflexes. No cranial nerve deficit or sensory deficit. Coordination normal.  Skin: Skin is warm and dry.  Psychiatric: She has a normal mood and affect. Her behavior is normal. Judgment and thought content normal.   +2 non-pitting edema in lower extremities. Symmetrical bilaterally.  MAU Course/MDM  Procedures  --Elevated BP of 126/97 on home cuff today. No additional elevations this pregnancy. No chronic HTN --Reactive tracing: baseline 140, mod variability, positive 10 x 10 accels, no decels --Toco: UI --Patient very sleepy during initial exam, single syllable responses to CNM inquiries. Positive Opiates. Patient states this is because she took a friend's Percocet x 1 tablet for pain two days ago. --Normotensive, negative PEC labs. Patient sleeping after headache cocktail --Discussed interventions for swelling including increased PO hydration with water, elevating legs at rest, compression socks.  Patient Vitals for the past 24  hrs:  BP Temp Temp src Pulse Resp SpO2 Height Weight  02/10/19 2001 (!) 125/58 - - 73 - - - -  02/10/19 1946 (!) 122/51 - - 70 - - - -  02/10/19 1931 129/81 - - 67 - - - -  02/10/19 1916 125/78 - - 71 - - - -  02/10/19 1900 118/77 - - 69 - - - -  02/10/19 1846 114/72 - - 68 - - - -  02/10/19 1839 118/72 - - 70 - - - -  02/10/19 1830 131/79 98.6 F (37 C) Oral 81 12 98 % - -  02/10/19 1808 135/70 98.2 F (36.8 C) Oral 67 18 100 % 5\' 10"  (1.778 m) 110.1 kg   Results for orders placed or performed during the hospital encounter of 02/10/19 (from the past 24 hour(s))  Protein / creatinine ratio, urine     Status:  None   Collection Time: 02/10/19  6:36 PM  Result Value Ref Range   Creatinine, Urine 82.37 mg/dL   Total Protein, Urine 7 mg/dL   Protein Creatinine Ratio 0.08 0.00 - 0.15 mg/mg[Cre]  CBC with Differential/Platelet     Status: Abnormal   Collection Time: 02/10/19  7:03 PM  Result Value Ref Range   WBC 5.2 4.0 - 10.5 K/uL   RBC 3.60 (L) 3.87 - 5.11 MIL/uL   Hemoglobin 8.5 (L) 12.0 - 15.0 g/dL   HCT 27.3 (L) 36.0 - 46.0 %   MCV 75.8 (L) 80.0 - 100.0 fL   MCH 23.6 (L) 26.0 - 34.0 pg   MCHC 31.1 30.0 - 36.0 g/dL   RDW 15.9 (H) 11.5 - 15.5 %   Platelets 202 150 - 400 K/uL   nRBC 0.0 0.0 - 0.2 %   Neutrophils Relative % 60 %   Neutro Abs 3.1 1.7 - 7.7 K/uL   Lymphocytes Relative 35 %   Lymphs Abs 1.8 0.7 - 4.0 K/uL   Monocytes Relative 4 %   Monocytes Absolute 0.2 0.1 - 1.0 K/uL   Eosinophils Relative 1 %   Eosinophils Absolute 0.0 0.0 - 0.5 K/uL   Basophils Relative 0 %   Basophils Absolute 0.0 0.0 - 0.1 K/uL   Immature Granulocytes 0 %   Abs Immature Granulocytes 0.01 0.00 - 0.07 K/uL  Comprehensive metabolic panel     Status: Abnormal   Collection Time: 02/10/19  7:03 PM  Result Value Ref Range   Sodium 136 135 - 145 mmol/L   Potassium 3.3 (L) 3.5 - 5.1 mmol/L   Chloride 107 98 - 111 mmol/L   CO2 22 22 - 32 mmol/L   Glucose, Bld 130 (H) 70 - 99 mg/dL   BUN <5  (L) 6 - 20 mg/dL   Creatinine, Ser 0.46 0.44 - 1.00 mg/dL   Calcium 8.2 (L) 8.9 - 10.3 mg/dL   Total Protein 5.8 (L) 6.5 - 8.1 g/dL   Albumin 2.4 (L) 3.5 - 5.0 g/dL   AST 12 (L) 15 - 41 U/L   ALT 8 0 - 44 U/L   Alkaline Phosphatase 93 38 - 126 U/L   Total Bilirubin 0.3 0.3 - 1.2 mg/dL   GFR calc non Af Amer >60 >60 mL/min   GFR calc Af Amer >60 >60 mL/min   Anion gap 7 5 - 15  Rapid urine drug screen (hospital performed)     Status: Abnormal   Collection Time: 02/10/19  7:07 PM  Result Value Ref Range   Opiates POSITIVE (A) NONE DETECTED   Cocaine NONE DETECTED NONE DETECTED   Benzodiazepines NONE DETECTED NONE DETECTED   Amphetamines NONE DETECTED NONE DETECTED   Tetrahydrocannabinol NONE DETECTED NONE DETECTED   Barbiturates NONE DETECTED NONE DETECTED   Assessment and Plan  --38 y.o. [id number] at [redacted]w[redacted]d  --Reactive tracing --Normotensive, normal PEC labs --Headache resolved with IV headache cocktail --Discharge home in stable condition  F/U: Pt has virtual appt at [facility] tomorrow 02/11/2019  Darlina Rumpf, [place] 02/10/2019, 9:45 PM

## 2019-02-11 ENCOUNTER — Telehealth (INDEPENDENT_AMBULATORY_CARE_PROVIDER_SITE_OTHER): Payer: Medicaid Other | Admitting: Obstetrics and Gynecology

## 2019-02-11 ENCOUNTER — Encounter: Payer: Self-pay | Admitting: Obstetrics and Gynecology

## 2019-02-11 DIAGNOSIS — O09299 Supervision of pregnancy with other poor reproductive or obstetric history, unspecified trimester: Secondary | ICD-10-CM

## 2019-02-11 DIAGNOSIS — Z3A32 32 weeks gestation of pregnancy: Secondary | ICD-10-CM

## 2019-02-11 DIAGNOSIS — O09899 Supervision of other high risk pregnancies, unspecified trimester: Secondary | ICD-10-CM

## 2019-02-11 DIAGNOSIS — O34219 Maternal care for unspecified type scar from previous cesarean delivery: Secondary | ICD-10-CM | POA: Diagnosis not present

## 2019-02-11 DIAGNOSIS — O0933 Supervision of pregnancy with insufficient antenatal care, third trimester: Secondary | ICD-10-CM | POA: Diagnosis not present

## 2019-02-11 DIAGNOSIS — O0990 Supervision of high risk pregnancy, unspecified, unspecified trimester: Secondary | ICD-10-CM

## 2019-02-11 DIAGNOSIS — R8271 Bacteriuria: Secondary | ICD-10-CM

## 2019-02-11 DIAGNOSIS — O09213 Supervision of pregnancy with history of pre-term labor, third trimester: Secondary | ICD-10-CM

## 2019-02-11 DIAGNOSIS — O09293 Supervision of pregnancy with other poor reproductive or obstetric history, third trimester: Secondary | ICD-10-CM

## 2019-02-11 DIAGNOSIS — O0993 Supervision of high risk pregnancy, unspecified, third trimester: Secondary | ICD-10-CM

## 2019-02-11 NOTE — Progress Notes (Signed)
   MY CHART VIDEO VIRTUAL OBSTETRICS VISIT ENCOUNTER NOTE  I connected with Sandra Banks on 02/11/19 at  1:50 PM EDT by My Chart video at home and verified that I am speaking with the correct person using two identifiers.   I discussed the limitations, risks, security and privacy concerns of performing an evaluation and management service by My Chart video and the availability of in person appointments. I also discussed with the patient that there may be a patient responsible charge related to this service. The patient expressed understanding and agreed to proceed.  Subjective:  Sandra Banks is a 38 y.o. [id number] at [redacted]w[redacted]d being followed for ongoing prenatal care.  She is currently monitored for the following issues for this high-risk pregnancy and has Supervision of high risk pregnancy, antepartum; History of cesarean delivery, currently pregnant; History of pre-eclampsia in prior pregnancy, currently pregnant, unspecified trimester; History of preterm delivery, currently pregnant, unspecified trimester; GBS bacteriuria; and Late prenatal care in third trimester on their problem list.  Patient reports swelling in feet that are sometimes painful. Was seen in MAU yesterday for same complaints. Reports drinking 6-8 bottles of water daily. Reports fetal movement. Denies any contractions, bleeding or leaking of fluid.   The following portions of the patient's history were reviewed and updated as appropriate: allergies, current medications, past family history, past medical history, past social history, past surgical history and problem list.   Objective:   General:  Alert, oriented and cooperative.   Mental Status: Normal mood and affect perceived. Normal judgment and thought content.  Rest of physical exam deferred due to type of encounter BP 137/84   Pulse 74   LMP 05/23/2018 (Approximate)  Taken by patient at home  Assessment and Plan:  Pregnancy: [id number] at [redacted]w[redacted]d 1. Supervision of  high risk pregnancy, antepartum - Reassurance given that swelling in BLE is normal variance of pregnancy - Advised to continue drinking plenty of water everyday  2. GBS bacteriuria - No need for retesting  4. History of cesarean delivery, currently pregnant - Plans to [place] - Will need TOLAC consent at nv  5. History of pre-eclampsia in prior pregnancy, currently pregnant, unspecified trimester - BP WNL, will continue to watch - Late to care - no bASA started  6. History of preterm delivery, currently pregnant, unspecified trimester   Preterm labor symptoms and general obstetric precautions including but not limited to vaginal bleeding, contractions, leaking of fluid and fetal movement were reviewed in detail with the patient.  I discussed the assessment and treatment plan with the patient. The patient was provided an opportunity to ask questions and all were answered. The patient agreed with the plan and demonstrated an understanding of the instructions. The patient was advised to call back or seek an in-person office evaluation/go to MAU at [facility] for any urgent or concerning symptoms. Please refer to After Visit Summary for other counseling recommendations.   I provided 10 minutes of non-face-to-face time during this encounter. There was 5 minutes of chart review time spent prior to this encounter. Total time spent = 15 minutes.   Return in about 4 weeks (around 03/11/2019) for Return OB w/GBS.  Future Appointments  Date Time Provider [place]  03/11/2019  3:30 PM [name], CNM [facility] None    [name], [place] for [employer], [place]

## 2019-03-11 ENCOUNTER — Encounter: Payer: Medicaid Other | Admitting: Obstetrics and Gynecology

## 2019-03-12 ENCOUNTER — Ambulatory Visit (INDEPENDENT_AMBULATORY_CARE_PROVIDER_SITE_OTHER): Payer: Medicaid Other | Admitting: Obstetrics and Gynecology

## 2019-03-12 ENCOUNTER — Encounter: Payer: Self-pay | Admitting: Obstetrics and Gynecology

## 2019-03-12 ENCOUNTER — Other Ambulatory Visit (HOSPITAL_COMMUNITY)
Admission: RE | Admit: 2019-03-12 | Discharge: 2019-03-12 | Disposition: A | Discharge status: Home / Self Care | Payer: Medicaid Other | Source: Ambulatory Visit | Attending: Obstetrics and Gynecology | Admitting: Obstetrics and Gynecology

## 2019-03-12 VITALS — BP 137/83 | HR 79 | Temp 97.9°F | Wt 244.8 lb

## 2019-03-12 DIAGNOSIS — M25476 Effusion, unspecified foot: Secondary | ICD-10-CM | POA: Diagnosis not present

## 2019-03-12 DIAGNOSIS — O34219 Maternal care for unspecified type scar from previous cesarean delivery: Secondary | ICD-10-CM | POA: Diagnosis not present

## 2019-03-12 DIAGNOSIS — O169 Unspecified maternal hypertension, unspecified trimester: Secondary | ICD-10-CM

## 2019-03-12 DIAGNOSIS — Z3A36 36 weeks gestation of pregnancy: Secondary | ICD-10-CM

## 2019-03-12 DIAGNOSIS — O0990 Supervision of high risk pregnancy, unspecified, unspecified trimester: Secondary | ICD-10-CM | POA: Insufficient documentation

## 2019-03-12 DIAGNOSIS — M25473 Effusion, unspecified ankle: Secondary | ICD-10-CM

## 2019-03-12 DIAGNOSIS — M25475 Effusion, left foot: Secondary | ICD-10-CM

## 2019-03-12 DIAGNOSIS — O0993 Supervision of high risk pregnancy, unspecified, third trimester: Secondary | ICD-10-CM | POA: Diagnosis not present

## 2019-03-12 DIAGNOSIS — O163 Unspecified maternal hypertension, third trimester: Secondary | ICD-10-CM | POA: Diagnosis not present

## 2019-03-12 NOTE — Progress Notes (Signed)
   PRENATAL VISIT NOTE  Subjective:  Sandra Banks is a 38 y.o. [id number] at [redacted]w[redacted]d being seen today for ongoing prenatal care.  She is currently monitored for the following issues for this high-risk pregnancy and has Supervision of high risk pregnancy, antepartum; History of cesarean delivery, currently pregnant; History of pre-eclampsia in prior pregnancy, currently pregnant, unspecified trimester; History of preterm delivery, currently pregnant, unspecified trimester; GBS bacteriuria; and Late prenatal care in third trimester on their problem list.  Patient reports occasional contractions and Swollen feet and ankles. She reports she is wearing a size 11 wide Crocs shoe and can barely get them on. Contractions: Not present. Vag. Bleeding: None.  Movement: Present. Denies leaking of fluid.   The following portions of the patient's history were reviewed and updated as appropriate: allergies, current medications, past family history, past medical history, past social history, past surgical history and problem list.   Objective:   Vitals:   03/12/19 0908 03/12/19 0933  BP: (!) 146/83 137/83  Pulse: 80 79  Temp: 97.9 F (36.6 C)   Weight: 244 lb 12.8 oz (111 kg)     Fetal Status: Fetal Heart Rate (bpm): 137 Fundal Height: 35 cm Movement: Present  Presentation: Vertex  General:  Alert, oriented and cooperative. Patient is in no acute distress.  Skin: Skin is warm and dry. No rash noted.   Cardiovascular: Normal heart rate noted  Respiratory: Normal respiratory effort, no problems with respiration noted  Abdomen: Soft, gravid, appropriate for gestational age.  Pain/Pressure: Present     Pelvic: Cervical exam performed Dilation: Closed Effacement (%): Thick Station: Ballotable  Extremities: Normal range of motion.  Edema: Deep pitting, indentation remains for a short time  Mental Status: Normal mood and affect. Normal behavior. Normal judgment and thought content.   Assessment and Plan:   Pregnancy: [id number] at [redacted]w[redacted]d 1. Supervision of high risk pregnancy, antepartum - Cervicovaginal ancillary only( [place]) - Protein / creatinine ratio, urine - CBC - Comprehensive metabolic panel  2. High blood pressure affecting pregnancy, antepartum - H/O PEC - Protein / creatinine ratio, urine - CBC - Comprehensive metabolic panel  3. Bilateral swelling of feet and ankles - Protein / creatinine ratio, urine - CBC - Comprehensive metabolic panel  4. Previous cesarean delivery affecting pregnancy, antepartum  - TOLAC consent signed today   Preterm labor symptoms and general obstetric precautions including but not limited to vaginal bleeding, contractions, leaking of fluid and fetal movement were reviewed in detail with the patient. Please refer to After Visit Summary for other counseling recommendations.   Return in about 1 week (around 03/19/2019) for Return OB - My Chart video.  Future Appointments  Date Time Provider [place]  03/20/2019 11:10 AM Ainsley Spinner, Dara Lords, CNM [facility] None    [name], [place]

## 2019-03-12 NOTE — Patient Instructions (Signed)
Preeclampsia and Eclampsia °Preeclampsia is a serious condition that may develop during pregnancy. This condition causes high blood pressure and increased protein in your urine along with other symptoms, such as headaches and vision changes. These symptoms may develop as the condition gets worse. Preeclampsia may occur at 20 weeks of pregnancy or later. °Diagnosing and treating preeclampsia early is very important. If not treated early, it can cause serious problems for you and your baby. One problem it can lead to is eclampsia. Eclampsia is a condition that causes muscle jerking or shaking (convulsions or seizures) and other serious problems for the mother. During pregnancy, delivering your baby may be the best treatment for preeclampsia or eclampsia. For most women, preeclampsia and eclampsia symptoms go away after giving birth. °In rare cases, a woman may develop preeclampsia after giving birth (postpartum preeclampsia). This usually occurs within 48 hours after childbirth but may occur up to 6 weeks after giving birth. °What are the causes? °The cause of preeclampsia is not known. °What increases the risk? °The following risk factors make you more likely to develop preeclampsia: °· Being pregnant for the first time. °· Having had preeclampsia during a past pregnancy. °· Having a family history of preeclampsia. °· Having high blood pressure. °· Being pregnant with more than one baby. °· Being 35 or older. °· Being African-American. °· Having kidney disease or diabetes. °· Having medical conditions such as lupus or blood diseases. °· Being very overweight (obese). °What are the signs or symptoms? °The most common symptoms are: °· Severe headaches. °· Vision problems, such as blurred or double vision. °· Abdominal pain, especially upper abdominal pain. °Other symptoms that may develop as the condition gets worse include: °· Sudden weight gain. °· Sudden swelling of the hands, face, legs, and feet. °· Severe nausea  and vomiting. °· Numbness in the face, arms, legs, and feet. °· Dizziness. °· Urinating less than usual. °· Slurred speech. °· Convulsions or seizures. °How is this diagnosed? °There are no screening tests for preeclampsia. Your health care provider will ask you about symptoms and check for signs of preeclampsia during your prenatal visits. You may also have tests that include: °· Checking your blood pressure. °· Urine tests to check for protein. Your health care provider will check for this at every prenatal visit. °· Blood tests. °· Monitoring your baby's heart rate. °· Ultrasound. °How is this treated? °You and your health care provider will determine the treatment approach that is best for you. Treatment may include: °· Having more frequent prenatal exams to check for signs of preeclampsia, if you have an increased risk for preeclampsia. °· Medicine to lower your blood pressure. °· Staying in the hospital, if your condition is severe. There, treatment will focus on controlling your blood pressure and the amount of fluids in your body (fluid retention). °· Taking medicine (magnesium sulfate) to prevent seizures. This may be given as an injection or through an IV. °· Taking a low-dose aspirin during your pregnancy. °· Delivering your baby early. You may have your labor started with medicine (induced), or you may have a cesarean delivery. °Follow these instructions at home: °Eating and drinking ° °· Drink enough fluid to keep your urine pale yellow. °· Avoid caffeine. °Lifestyle °· Do not use any products that contain nicotine or tobacco, such as cigarettes and e-cigarettes. If you need help quitting, ask your health care provider. °· Do not use alcohol or drugs. °· Avoid stress as much as possible. Rest and get   plenty of sleep. °General instructions °· Take over-the-counter and prescription medicines only as told by your health care provider. °· When lying down, lie on your left side. This keeps pressure off your  major blood vessels. °· When sitting or lying down, raise (elevate) your feet. Try putting some pillows underneath your lower legs. °· Exercise regularly. Ask your health care provider what kinds of exercise are best for you. °· Keep all follow-up and prenatal visits as told by your health care provider. This is important. °How is this prevented? °There is no known way of preventing preeclampsia or eclampsia from developing. However, to lower your risk of complications and detect problems early: °· Get regular prenatal care. Your health care provider may be able to diagnose and treat the condition early. °· Maintain a healthy weight. Ask your health care provider for help managing weight gain during pregnancy. °· Work with your health care provider to manage any long-term (chronic) health conditions you have, such as diabetes or kidney problems. °· You may have tests of your blood pressure and kidney function after giving birth. °· Your health care provider may have you take low-dose aspirin during your next pregnancy. °Contact a health care provider if: °· You have symptoms that your health care provider told you may require more treatment or monitoring, such as: °? Headaches. °? Nausea or vomiting. °? Abdominal pain. °? Dizziness. °? Light-headedness. °Get help right away if: °· You have severe: °? Abdominal pain. °? Headaches that do not get better. °? Dizziness. °? Vision problems. °? Confusion. °? Nausea or vomiting. °· You have any of the following: °? A seizure. °? Sudden, rapid weight gain. °? Sudden swelling in your hands, ankles, or face. °? Trouble moving any part of your body. °? Numbness in any part of your body. °? Trouble speaking. °? Abnormal bleeding. °· You faint. °Summary °· Preeclampsia is a serious condition that may develop during pregnancy. °· This condition causes high blood pressure and increased protein in your urine along with other symptoms, such as headaches and vision  changes. °· Diagnosing and treating preeclampsia early is very important. If not treated early, it can cause serious problems for you and your baby. °· Get help right away if you have symptoms that your health care provider told you to watch for. °This information is not intended to replace advice given to you by your health care provider. Make sure you discuss any questions you have with your health care provider. °Document Released: 08/17/2000 Document Revised: 04/22/2018 Document Reviewed: 03/26/2016 °Elsevier Patient Education © 2020 [employer] ° °

## 2019-03-13 ENCOUNTER — Encounter: Payer: Self-pay | Admitting: General Practice

## 2019-03-13 LAB — CBC
Hematocrit: 28.3 % — ABNORMAL LOW (ref 34.0–46.6)
Hemoglobin: 8.9 g/dL — ABNORMAL LOW (ref 11.1–15.9)
MCH: 23 pg — ABNORMAL LOW (ref 26.6–33.0)
MCHC: 31.4 g/dL — ABNORMAL LOW (ref 31.5–35.7)
MCV: 73 fL — ABNORMAL LOW (ref 79–97)
Platelets: 192 10*3/uL (ref 150–450)
RBC: 3.87 x10E6/uL (ref 3.77–5.28)
RDW: 15.2 % (ref 11.7–15.4)
WBC: 5.5 10*3/uL (ref 3.4–10.8)

## 2019-03-13 LAB — COMPREHENSIVE METABOLIC PANEL
ALT: 5 IU/L (ref 0–32)
AST: 11 IU/L (ref 0–40)
Albumin/Globulin Ratio: 1.3 (ref 1.2–2.2)
Albumin: 3.4 g/dL — ABNORMAL LOW (ref 3.8–4.8)
Alkaline Phosphatase: 144 IU/L — ABNORMAL HIGH (ref 39–117)
BUN/Creatinine Ratio: 12 (ref 9–23)
BUN: 6 mg/dL (ref 6–20)
Bilirubin Total: <0.2 mg/dL (ref 0.0–1.2)
CO2: 19 mmol/L — ABNORMAL LOW (ref 20–29)
Calcium: 8.3 mg/dL — ABNORMAL LOW (ref 8.7–10.2)
Chloride: 106 mmol/L (ref 96–106)
Creatinine, Ser: 0.5 mg/dL — ABNORMAL LOW (ref 0.57–1.00)
GFR calc Af Amer: 144 mL/min/{1.73_m2} (ref >59)
GFR calc non Af Amer: 125 mL/min/{1.73_m2} (ref >59)
Globulin, Total: 2.7 g/dL (ref 1.5–4.5)
Glucose: 83 mg/dL (ref 65–99)
Potassium: 3.8 mmol/L (ref 3.5–5.2)
Sodium: 138 mmol/L (ref 134–144)
Total Protein: 6.1 g/dL (ref 6.0–8.5)

## 2019-03-13 LAB — CERVICOVAGINAL ANCILLARY ONLY
Bacterial vaginitis: POSITIVE — AB
Candida vaginitis: NEGATIVE
Chlamydia: NEGATIVE
Neisseria Gonorrhea: NEGATIVE
Trichomonas: NEGATIVE

## 2019-03-13 LAB — PROTEIN / CREATININE RATIO, URINE
Creatinine, Urine: 155.6 mg/dL
Protein, Ur: 53.7 mg/dL
Protein/Creat Ratio: 345 mg/g creat — ABNORMAL HIGH (ref 0–200)

## 2019-03-14 ENCOUNTER — Encounter: Payer: Self-pay | Admitting: Obstetrics and Gynecology

## 2019-03-14 ENCOUNTER — Inpatient Hospital Stay (HOSPITAL_COMMUNITY)
Admission: AD | Admit: 2019-03-14 | Discharge: 2019-03-17 | DRG: 788 | Disposition: A | Discharge status: Home / Self Care | Payer: Medicaid Other | Attending: Obstetrics & Gynecology | Admitting: Obstetrics & Gynecology

## 2019-03-14 ENCOUNTER — Other Ambulatory Visit (HOSPITAL_COMMUNITY): Payer: Self-pay | Admitting: *Deleted

## 2019-03-14 ENCOUNTER — Inpatient Hospital Stay (HOSPITAL_COMMUNITY)
Admission: AD | Admit: 2019-03-14 | Discharge: 2019-03-14 | Discharge status: Absent without leave | Payer: Medicaid Other | Source: Home / Self Care | Attending: Obstetrics and Gynecology | Admitting: Obstetrics and Gynecology

## 2019-03-14 ENCOUNTER — Other Ambulatory Visit: Payer: Self-pay | Admitting: Obstetrics and Gynecology

## 2019-03-14 DIAGNOSIS — O0933 Supervision of pregnancy with insufficient antenatal care, third trimester: Secondary | ICD-10-CM

## 2019-03-14 DIAGNOSIS — O99334 Smoking (tobacco) complicating childbirth: Secondary | ICD-10-CM | POA: Diagnosis present

## 2019-03-14 DIAGNOSIS — O134 Gestational [pregnancy-induced] hypertension without significant proteinuria, complicating childbirth: Secondary | ICD-10-CM | POA: Diagnosis not present

## 2019-03-14 DIAGNOSIS — O1493 Unspecified pre-eclampsia, third trimester: Secondary | ICD-10-CM | POA: Diagnosis present

## 2019-03-14 DIAGNOSIS — O99824 Streptococcus B carrier state complicating childbirth: Secondary | ICD-10-CM | POA: Diagnosis not present

## 2019-03-14 DIAGNOSIS — F17210 Nicotine dependence, cigarettes, uncomplicated: Secondary | ICD-10-CM | POA: Diagnosis present

## 2019-03-14 DIAGNOSIS — O09899 Supervision of other high risk pregnancies, unspecified trimester: Secondary | ICD-10-CM

## 2019-03-14 DIAGNOSIS — O4593 Premature separation of placenta, unspecified, third trimester: Secondary | ICD-10-CM | POA: Diagnosis present

## 2019-03-14 DIAGNOSIS — O34211 Maternal care for low transverse scar from previous cesarean delivery: Secondary | ICD-10-CM | POA: Diagnosis not present

## 2019-03-14 DIAGNOSIS — Z3A36 36 weeks gestation of pregnancy: Secondary | ICD-10-CM

## 2019-03-14 DIAGNOSIS — Z1159 Encounter for screening for other viral diseases: Secondary | ICD-10-CM

## 2019-03-14 DIAGNOSIS — O1404 Mild to moderate pre-eclampsia, complicating childbirth: Principal | ICD-10-CM | POA: Diagnosis present

## 2019-03-14 DIAGNOSIS — O0990 Supervision of high risk pregnancy, unspecified, unspecified trimester: Secondary | ICD-10-CM

## 2019-03-14 DIAGNOSIS — O09299 Supervision of pregnancy with other poor reproductive or obstetric history, unspecified trimester: Secondary | ICD-10-CM

## 2019-03-14 DIAGNOSIS — O1494 Unspecified pre-eclampsia, complicating childbirth: Secondary | ICD-10-CM | POA: Diagnosis not present

## 2019-03-14 DIAGNOSIS — R8271 Bacteriuria: Secondary | ICD-10-CM

## 2019-03-14 DIAGNOSIS — O99013 Anemia complicating pregnancy, third trimester: Secondary | ICD-10-CM

## 2019-03-14 DIAGNOSIS — D649 Anemia, unspecified: Secondary | ICD-10-CM | POA: Diagnosis not present

## 2019-03-14 DIAGNOSIS — Z98891 History of uterine scar from previous surgery: Secondary | ICD-10-CM

## 2019-03-14 DIAGNOSIS — O34219 Maternal care for unspecified type scar from previous cesarean delivery: Secondary | ICD-10-CM | POA: Diagnosis present

## 2019-03-14 DIAGNOSIS — Z3A37 37 weeks gestation of pregnancy: Secondary | ICD-10-CM | POA: Diagnosis not present

## 2019-03-14 DIAGNOSIS — O9902 Anemia complicating childbirth: Secondary | ICD-10-CM | POA: Diagnosis not present

## 2019-03-14 DIAGNOSIS — F1410 Cocaine abuse, uncomplicated: Secondary | ICD-10-CM | POA: Diagnosis present

## 2019-03-14 NOTE — H&P (Addendum)
LABOR AND DELIVERY ADMISSION HISTORY AND PHYSICAL NOTE  Sandra Banks is a 38 y.o. female [phone] with IUP at [redacted]w[redacted]d by early U/S presenting for IOL due to gHTN.  She reports positive fetal movement. She denies leakage of fluid or vaginal bleeding.  Prenatal History: PNC at [place], late to care around [redacted] weeks gestation.   Pregnancy complications:  - gHTN, history of preeclampsia in prior pregnancy - TOLAC, Successful VBAC x5 (1 prior C/S in 2005)  - History of pre-term delivery  - GBS bacteruria  - Tobacco use during pregnancy (1/2 ppd in the beginning, now at 3 cigarettes)  - Positive UDS for opiates in 02/2019 >> taking percocet intermittently for pain due to LE edema, no history of chronic opioid use  - Microcytic anemia during pregnancy (Hgb 8.9 on 03/12/2019), did not start taking prescribed iron  - AMA (38 yo)    Past Medical History: Past Medical History:  Diagnosis Date  . Anemia   . Complication of anesthesia   . Hidradenitis suppurativa of left axilla   . Hidradenitis suppurativa of right axilla   . Preeclampsia    2005    Past Surgical History: Past Surgical History:  Procedure Laterality Date  . CESAREAN SECTION    . WISDOM TOOTH EXTRACTION      Obstetrical History: OB History    Gravida  8   Para  6   Term  5   Preterm  1   AB  1   Living  6     SAB  1   TAB      Ectopic      Multiple      Live Births  6        Obstetric Comments  GA-38-40 wks per pt        Social History: Social History   Socioeconomic History  . Marital status: Single    Spouse name: Not on file  . Number of children: 7  . Years of education: 12th grade  . Highest education level: Some college, no degree  Occupational History  . Occupation: Unemployed  Social Needs  . Financial resource strain: Not hard at all  . Food insecurity    Worry: Never true    Inability: Never true  . Transportation needs    Medical: No    Non-medical: No  Tobacco Use   . Smoking status: Former Smoker    Packs/day: 0.25    Years: 8.00    Pack years: 2.00    Types: Cigarettes  . Smokeless tobacco: Never Used  Substance and Sexual Activity  . Alcohol use: No  . Drug use: No  . Sexual activity: Not Currently    Birth control/protection: None    Comment: last IC-last Thursday  Lifestyle  . Physical activity    Days per week: 0 days    Minutes per session: Not on file  . Stress: Not at all  Relationships  . Social connections    Talks on phone: More than three times a week    Gets together: More than three times a week    Attends religious service: More than 4 times per year    Active member of club or organization: No    Attends meetings of clubs or organizations: Never    Relationship status: Never married  Other Topics Concern  . Not on file  Social History Narrative  . Not on file    Family History: Family History  Problem Relation  Age of Onset  . Hypertension Mother   . Seizures Mother   . Hypertension Maternal Grandmother     Allergies: No Known Allergies  Medications Prior to Admission  Medication Sig Dispense Refill Last Dose  . Blood Pressure Monitoring (BLOOD PRESSURE MONITOR AUTOMAT) DEVI Appropriate size cuff (forearm circumference) with automatic BP monitor. To be monitored regularly at home. ICD-10 code: 80O09.90, Supervision of high risk pregnancy. 1 Device 0   . ferrous sulfate 325 (65 FE) MG tablet Take 1 tablet (325 mg total) by mouth daily. (Patient not taking: Reported on 10/11/2018) 30 tablet 0   . metroNIDAZOLE (FLAGYL) 500 MG tablet Take 1 tablet (500 mg total) by mouth 2 (two) times daily. (Patient not taking: Reported on 03/12/2019) 14 tablet 0   . NIFEdipine (PROCARDIA) 10 MG capsule Take 1 capsule (10 mg total) by mouth every 6 (six) hours as needed (for greater than 5 contractions per hour). 30 capsule 0   . ondansetron (ZOFRAN ODT) 4 MG disintegrating tablet Take 1 tablet (4 mg total) by mouth every 6 (six) hours  as needed for nausea. 20 tablet 0   . terconazole (TERAZOL 3) 0.8 % vaginal cream Place 1 applicator vaginally at bedtime. 20 g 0   . terconazole (TERAZOL 7) 0.4 % vaginal cream Place 1 applicator vaginally at bedtime. Use for seven days 45 g 0      Review of Systems  All systems reviewed and negative except as stated in HPI  Physical Exam Blood pressure 129/86, pulse 73, temperature 98.2 F (36.8 C), resp. rate 16, height 5\' 10"  (1.778 m), weight 113.5 kg, last menstrual period 05/23/2018, unknown if currently breastfeeding. General appearance: alert, oriented, NAD Lungs: normal respiratory effort Heart: regular rate Abdomen: soft, non-tender; gravid, FH appropriate for GA Extremities: mild pitting edema bilaterally  Fetal monitoring: 125 mod var, + accel, - decel  Uterine activity: irritable  Dilation: Fingertip Effacement (%): Thick Exam by:: Dr. Annia FriendlyBeard  Prenatal labs: ABO, Rh: --/--/O POS, O POS Performed at [facility], [address] [place], [place] [address]  [phone](07/12 0052) Antibody: NEG (07/12 0052) Rubella: 1.49 (05/06 0851) RPR: Non Reactive (05/06 0851)  HBsAg: Negative (05/06 0851)  HIV: Non Reactive (05/06 0851)  GC/Chlamydia: negative  GBS:  Positive in urine  2-hr GTT: Normal  Genetic screening: Too late  Anatomy US: Normal per 3rd trimester U/S   Prenatal Transfer Tool  Maternal Diabetes: No Genetic Screening: Declined Maternal Ultrasounds/Referrals: Normal Fetal Ultrasounds or other Referrals:  None Maternal Substance Abuse:  Yes:  Type: Smoker Significant Maternal Medications:  None Significant Maternal Lab Results: Group B Strep positive  Results for orders placed or performed during the hospital encounter of 03/14/19 (from the past 24 hour(s))  SARS Coronavirus 2 (CEPHEID - Performed in [facility] hospital lab), [facility] Order   Collection Time: 03/14/19 11:14 PM   Specimen: Nasopharyngeal Swab  Result Value Ref Range   SARS Coronavirus 2  NEGATIVE NEGATIVE  CBC   Collection Time: 03/15/19 12:52 AM  Result Value Ref Range   WBC 5.8 4.0 - 10.5 K/uL   RBC 3.68 (L) 3.87 - 5.11 MIL/uL   Hemoglobin 8.5 (L) 12.0 - 15.0 g/dL   HCT 78.227.8 (L) 95.636.0 - 21.346.0 %   MCV 75.5 (L) 80.0 - 100.0 fL   MCH 23.1 (L) 26.0 - 34.0 pg   MCHC 30.6 30.0 - 36.0 g/dL   RDW 08.616.2 (H) 57.811.5 - 46.915.5 %   Platelets 184 150 -  400 K/uL   nRBC 0.0 0.0 - 0.2 %  Comprehensive metabolic panel   Collection Time: 03/15/19 12:52 AM  Result Value Ref Range   Sodium 136 135 - 145 mmol/L   Potassium 3.1 (L) 3.5 - 5.1 mmol/L   Chloride 108 98 - 111 mmol/L   CO2 20 (L) 22 - 32 mmol/L   Glucose, Bld 97 70 - 99 mg/dL   BUN 6 6 - 20 mg/dL   Creatinine, Ser 1.190.54 0.44 - 1.00 mg/dL   Calcium 8.4 (L) 8.9 - 10.3 mg/dL   Total Protein 6.4 (L) 6.5 - 8.1 g/dL   Albumin 2.6 (L) 3.5 - 5.0 g/dL   AST 15 15 - 41 U/L   ALT 8 0 - 44 U/L   Alkaline Phosphatase 147 (H) 38 - 126 U/L   Total Bilirubin 0.4 0.3 - 1.2 mg/dL   GFR calc non Af Amer >60 >60 mL/min   GFR calc Af Amer >60 >60 mL/min   Anion gap 8 5 - 15  Type and screen   Collection Time: 03/15/19 12:52 AM  Result Value Ref Range   ABO/RH(D) O POS    Antibody Screen NEG    Sample Expiration      03/18/2019,2359 Performed at [facility], [address] [place], [place] [address]   ABO/Rh   Collection Time: 03/15/19 12:52 AM  Result Value Ref Range   ABO/RH(D)      O POS Performed at [facility], [address] [place], [place] [address]   Urine rapid drug screen (hosp performed)   Collection Time: 03/15/19  1:21 AM  Result Value Ref Range   Opiates POSITIVE (A) NONE DETECTED   Cocaine POSITIVE (A) NONE DETECTED   Benzodiazepines NONE DETECTED NONE DETECTED   Amphetamines NONE DETECTED NONE DETECTED   Tetrahydrocannabinol NONE DETECTED NONE DETECTED   Barbiturates NONE DETECTED NONE DETECTED  Protein / creatinine ratio, urine   Collection Time: 03/15/19  1:21 AM  Result Value Ref Range    Creatinine, Urine 194.90 mg/dL   Total Protein, Urine 22 mg/dL   Protein Creatinine Ratio 0.11 0.00 - 0.15 mg/mg[Cre]    Patient Active Problem List   Diagnosis Date Noted  . Preeclampsia, third trimester 03/14/2019  . Late prenatal care in third trimester 01/28/2019  . GBS bacteriuria 01/02/2019  . Supervision of high risk pregnancy, antepartum 12/30/2018  . History of cesarean delivery, currently pregnant 12/30/2018  . History of pre-eclampsia in prior pregnancy, currently pregnant, unspecified trimester 12/30/2018  . History of preterm delivery, currently pregnant, unspecified trimester 12/30/2018    Assessment: [name] is a 38 y.o. [id number] at 6533w6d here for IOL due to mild pre-e. Pregnancy complicated by mild pre-eclampsia, TOLAC with successful VBAC x5 (1 prior C/S in 2005), history of pre-term delivery, GBS bacteruria, tobacco use, Positive UDS for opiates, anemia, and AMA.    #Labor: Unfavorable exam for FB placement. Start pit 2x2 given TOLAC.  #Pain: Desires epidural  #FWB: Cat 1 strip  #ID: GBS bacteruria, PCN  #MOF: Breastfeeding and formula   #MOC: Nexplanon inpatient   1. Gestational HTN Diagnosed on 7/9. BP normal to moderate range here. Asymptomatic. UPC mildly elevated, however CMP and plts unremarkable.  --Monitor BP and s/sx   2. TOLAC:  Avoid cytotec. Consent signed in chart and reviewed verbally.  3. Tobacco use  use of opiates during pregnancy:  3 Cigarettes daily, trying to quit. Reports opioids due to severe pain with  LE edema, no history of chronic opioid use.  --UDS  --Consult to social work   4. Microcytic anemia:  Hgb 8.9 on 03/12/2019. Did not take previous ferrous sulfate prescribed. Will need iron supplementation on discharge.   5. Healthcare maintenance:  Needs papsmear on post-partum follow up.   Leticia PennaSamantha Beard, DO, PGY2  Attestation: I agree with the resident's documentation and plan of care. Will start induction with low-dose  pitocin and hopefully place FB at next check.  Cristal DeerLaurel S. Earlene PlaterWallace, DO OB/GYN Fellow

## 2019-03-14 NOTE — Progress Notes (Signed)
IOL orders done 

## 2019-03-15 ENCOUNTER — Telehealth: Payer: Self-pay | Admitting: Obstetrics and Gynecology

## 2019-03-15 ENCOUNTER — Inpatient Hospital Stay (HOSPITAL_COMMUNITY): Payer: Medicaid Other

## 2019-03-15 ENCOUNTER — Other Ambulatory Visit: Payer: Self-pay

## 2019-03-15 ENCOUNTER — Inpatient Hospital Stay (HOSPITAL_COMMUNITY): Payer: Medicaid Other | Admitting: Anesthesiology

## 2019-03-15 ENCOUNTER — Encounter (HOSPITAL_COMMUNITY)
Admission: AD | Disposition: A | Discharge status: Home / Self Care | Payer: Self-pay | Source: Home / Self Care | Attending: Obstetrics & Gynecology

## 2019-03-15 ENCOUNTER — Encounter (HOSPITAL_COMMUNITY): Payer: Self-pay | Admitting: Obstetrics and Gynecology

## 2019-03-15 DIAGNOSIS — O99824 Streptococcus B carrier state complicating childbirth: Secondary | ICD-10-CM

## 2019-03-15 DIAGNOSIS — O4593 Premature separation of placenta, unspecified, third trimester: Secondary | ICD-10-CM

## 2019-03-15 DIAGNOSIS — O134 Gestational [pregnancy-induced] hypertension without significant proteinuria, complicating childbirth: Secondary | ICD-10-CM

## 2019-03-15 DIAGNOSIS — F1410 Cocaine abuse, uncomplicated: Secondary | ICD-10-CM | POA: Diagnosis present

## 2019-03-15 DIAGNOSIS — Z3A36 36 weeks gestation of pregnancy: Secondary | ICD-10-CM

## 2019-03-15 LAB — COMPREHENSIVE METABOLIC PANEL
ALT: 8 U/L (ref 0–44)
AST: 15 U/L (ref 15–41)
Albumin: 2.6 g/dL — ABNORMAL LOW (ref 3.5–5.0)
Alkaline Phosphatase: 147 U/L — ABNORMAL HIGH (ref 38–126)
Anion gap: 8 (ref 5–15)
BUN: 6 mg/dL (ref 6–20)
CO2: 20 mmol/L — ABNORMAL LOW (ref 22–32)
Calcium: 8.4 mg/dL — ABNORMAL LOW (ref 8.9–10.3)
Chloride: 108 mmol/L (ref 98–111)
Creatinine, Ser: 0.54 mg/dL (ref 0.44–1.00)
GFR calc Af Amer: >60 mL/min (ref >60)
GFR calc non Af Amer: >60 mL/min (ref >60)
Glucose, Bld: 97 mg/dL (ref 70–99)
Potassium: 3.1 mmol/L — ABNORMAL LOW (ref 3.5–5.1)
Sodium: 136 mmol/L (ref 135–145)
Total Bilirubin: 0.4 mg/dL (ref 0.3–1.2)
Total Protein: 6.4 g/dL — ABNORMAL LOW (ref 6.5–8.1)

## 2019-03-15 LAB — SARS CORONAVIRUS 2 BY RT PCR (HOSPITAL ORDER, PERFORMED IN ~~LOC~~ HOSPITAL LAB): SARS Coronavirus 2: NEGATIVE

## 2019-03-15 LAB — CBC
HCT: 27.8 % — ABNORMAL LOW (ref 36.0–46.0)
HCT: 24.6 % — ABNORMAL LOW (ref 36.0–46.0)
Hemoglobin: 8.5 g/dL — ABNORMAL LOW (ref 12.0–15.0)
Hemoglobin: 7.8 g/dL — ABNORMAL LOW (ref 12.0–15.0)
MCH: 23.1 pg — ABNORMAL LOW (ref 26.0–34.0)
MCH: 23.2 pg — ABNORMAL LOW (ref 26.0–34.0)
MCHC: 30.6 g/dL (ref 30.0–36.0)
MCHC: 31.7 g/dL (ref 30.0–36.0)
MCV: 75.5 fL — ABNORMAL LOW (ref 80.0–100.0)
MCV: 73.2 fL — ABNORMAL LOW (ref 80.0–100.0)
Platelets: 184 10*3/uL (ref 150–400)
Platelets: 146 10*3/uL — ABNORMAL LOW (ref 150–400)
RBC: 3.68 MIL/uL — ABNORMAL LOW (ref 3.87–5.11)
RBC: 3.36 MIL/uL — ABNORMAL LOW (ref 3.87–5.11)
RDW: 16.2 % — ABNORMAL HIGH (ref 11.5–15.5)
RDW: 16.1 % — ABNORMAL HIGH (ref 11.5–15.5)
WBC: 5.8 10*3/uL (ref 4.0–10.5)
WBC: 15.1 10*3/uL — ABNORMAL HIGH (ref 4.0–10.5)
nRBC: 0 % (ref 0.0–0.2)
nRBC: 0 % (ref 0.0–0.2)

## 2019-03-15 LAB — RAPID URINE DRUG SCREEN, HOSP PERFORMED
Amphetamines: NOT DETECTED
Barbiturates: NOT DETECTED
Benzodiazepines: NOT DETECTED
Cocaine: POSITIVE — AB
Opiates: POSITIVE — AB
Tetrahydrocannabinol: NOT DETECTED

## 2019-03-15 LAB — PROTEIN / CREATININE RATIO, URINE
Creatinine, Urine: 194.9 mg/dL
Protein Creatinine Ratio: 0.11 mg/mg{Cre} (ref 0.00–0.15)
Total Protein, Urine: 22 mg/dL

## 2019-03-15 LAB — RPR: RPR Ser Ql: NONREACTIVE

## 2019-03-15 SURGERY — Surgical Case
Anesthesia: Epidural | Site: Abdomen | Wound class: Clean Contaminated

## 2019-03-15 MED ORDER — ONDANSETRON HCL 4 MG/2ML IJ SOLN: 4.0000 mg | Freq: Four times a day (QID) | INTRAMUSCULAR | Status: DC | PRN

## 2019-03-15 MED ORDER — NALBUPHINE HCL 10 MG/ML IJ SOLN: 5.0000 mg | Freq: Once | INTRAMUSCULAR | Status: DC | PRN

## 2019-03-15 MED ORDER — DEXAMETHASONE SODIUM PHOSPHATE 10 MG/ML IJ SOLN
INTRAMUSCULAR | Status: AC
  Filled 2019-03-15: qty 1

## 2019-03-15 MED ORDER — NALOXONE HCL 0.4 MG/ML IJ SOLN: 0.4000 mg | INTRAMUSCULAR | Status: DC | PRN

## 2019-03-15 MED ORDER — FENTANYL-BUPIVACAINE-NACL 0.5-0.125-0.9 MG/250ML-% EP SOLN: 12.0000 mL/h | EPIDURAL | Status: DC | PRN

## 2019-03-15 MED ORDER — SODIUM CHLORIDE 0.9% FLUSH: 9.0000 mL | INTRAVENOUS | Status: DC | PRN

## 2019-03-15 MED ORDER — SODIUM CHLORIDE 0.9 % IR SOLN
Status: DC | PRN
  Administered 2019-03-15: 1000 mL

## 2019-03-15 MED ORDER — FENTANYL CITRATE (PF) 100 MCG/2ML IJ SOLN
100.0000 ug | INTRAMUSCULAR | Status: DC | PRN
  Administered 2019-03-15: 50 ug via INTRAVENOUS
  Filled 2019-03-15: qty 2

## 2019-03-15 MED ORDER — ONDANSETRON HCL 4 MG/2ML IJ SOLN
INTRAMUSCULAR | Status: DC | PRN
  Administered 2019-03-15: 4 mg via INTRAVENOUS

## 2019-03-15 MED ORDER — EPHEDRINE 5 MG/ML INJ: 10.0000 mg | INTRAVENOUS | Status: DC | PRN

## 2019-03-15 MED ORDER — METHYLERGONOVINE MALEATE 0.2 MG/ML IJ SOLN
INTRAMUSCULAR | Status: DC | PRN
  Administered 2019-03-15: 0.2 mg via INTRAMUSCULAR

## 2019-03-15 MED ORDER — LIDOCAINE 2% (20 MG/ML) 5 ML SYRINGE
INTRAMUSCULAR | Status: DC | PRN
  Administered 2019-03-15: 5 mL via INTRAVENOUS
  Administered 2019-03-15: 10 mL via INTRAVENOUS

## 2019-03-15 MED ORDER — HYDROMORPHONE HCL 1 MG/ML IJ SOLN
1.0000 mg | Freq: Once | INTRAMUSCULAR | Status: AC
  Administered 2019-03-15: 1 mg via INTRAVENOUS

## 2019-03-15 MED ORDER — PRENATAL MULTIVITAMIN CH
1.0000 | ORAL_TABLET | Freq: Every day | ORAL | Status: DC
  Filled 2019-03-15 (×2): qty 1

## 2019-03-15 MED ORDER — SIMETHICONE 80 MG PO CHEW
80.0000 mg | CHEWABLE_TABLET | Freq: Three times a day (TID) | ORAL | Status: DC
  Administered 2019-03-16 (×2): 80 mg via ORAL
  Filled 2019-03-15 (×5): qty 1

## 2019-03-15 MED ORDER — MORPHINE SULFATE (PF) 0.5 MG/ML IJ SOLN
INTRAMUSCULAR | Status: AC
  Filled 2019-03-15: qty 10

## 2019-03-15 MED ORDER — FENTANYL CITRATE (PF) 100 MCG/2ML IJ SOLN
INTRAMUSCULAR | Status: AC
  Filled 2019-03-15: qty 2

## 2019-03-15 MED ORDER — SIMETHICONE 80 MG PO CHEW
80.0000 mg | CHEWABLE_TABLET | ORAL | Status: DC
  Administered 2019-03-15 – 2019-03-16 (×2): 80 mg via ORAL
  Filled 2019-03-15 (×3): qty 1

## 2019-03-15 MED ORDER — DIPHENHYDRAMINE HCL 25 MG PO CAPS: 25.0000 mg | ORAL_CAPSULE | Freq: Four times a day (QID) | ORAL | Status: DC | PRN

## 2019-03-15 MED ORDER — LIDOCAINE-EPINEPHRINE (PF) 2 %-1:200000 IJ SOLN
INTRAMUSCULAR | Status: AC
  Filled 2019-03-15: qty 10

## 2019-03-15 MED ORDER — NALBUPHINE HCL 10 MG/ML IJ SOLN: 5.0000 mg | INTRAMUSCULAR | Status: DC | PRN

## 2019-03-15 MED ORDER — PHENYLEPHRINE 40 MCG/ML (10ML) SYRINGE FOR IV PUSH (FOR BLOOD PRESSURE SUPPORT): 80.0000 ug | PREFILLED_SYRINGE | INTRAVENOUS | Status: DC | PRN

## 2019-03-15 MED ORDER — LACTATED RINGERS IV SOLN: 500.0000 mL | Freq: Once | INTRAVENOUS | Status: DC

## 2019-03-15 MED ORDER — PENICILLIN G 3 MILLION UNITS IVPB - SIMPLE MED
3.0000 10*6.[IU] | INTRAVENOUS | Status: DC
  Administered 2019-03-15: 3 10*6.[IU] via INTRAVENOUS
  Filled 2019-03-15: qty 100

## 2019-03-15 MED ORDER — DIPHENHYDRAMINE HCL 50 MG/ML IJ SOLN: 12.5000 mg | Freq: Four times a day (QID) | INTRAMUSCULAR | Status: DC | PRN

## 2019-03-15 MED ORDER — HYDROXYZINE HCL 50 MG PO TABS: 50.0000 mg | ORAL_TABLET | Freq: Four times a day (QID) | ORAL | Status: DC | PRN

## 2019-03-15 MED ORDER — LIDOCAINE HCL (PF) 1 % IJ SOLN
INTRAMUSCULAR | Status: DC | PRN
  Administered 2019-03-15 (×2): 4 mL via EPIDURAL

## 2019-03-15 MED ORDER — OXYCODONE HCL 5 MG PO TABS
5.0000 mg | ORAL_TABLET | ORAL | Status: DC | PRN
  Administered 2019-03-16 – 2019-03-17 (×6): 10 mg via ORAL
  Filled 2019-03-15 (×6): qty 2

## 2019-03-15 MED ORDER — COCONUT OIL OIL
1.0000 "application " | TOPICAL_OIL | Status: DC | PRN
  Filled 2019-03-15: qty 120

## 2019-03-15 MED ORDER — SODIUM CHLORIDE 0.9 % IV SOLN
INTRAVENOUS | Status: DC
  Administered 2019-03-15: 23:00:00 via INTRAVENOUS

## 2019-03-15 MED ORDER — DIPHENHYDRAMINE HCL 12.5 MG/5ML PO ELIX
12.5000 mg | ORAL_SOLUTION | Freq: Four times a day (QID) | ORAL | Status: DC | PRN
  Filled 2019-03-15: qty 5

## 2019-03-15 MED ORDER — IBUPROFEN 800 MG PO TABS
800.0000 mg | ORAL_TABLET | Freq: Four times a day (QID) | ORAL | Status: DC
  Administered 2019-03-16 – 2019-03-17 (×4): 800 mg via ORAL
  Filled 2019-03-15 (×5): qty 1

## 2019-03-15 MED ORDER — ONDANSETRON HCL 4 MG/2ML IJ SOLN
INTRAMUSCULAR | Status: AC
  Filled 2019-03-15: qty 2

## 2019-03-15 MED ORDER — OXYTOCIN 40 UNITS IN NORMAL SALINE INFUSION - SIMPLE MED
INTRAVENOUS | Status: AC
  Filled 2019-03-15: qty 1000

## 2019-03-15 MED ORDER — HYDROMORPHONE HCL 1 MG/ML IJ SOLN
INTRAMUSCULAR | Status: AC
  Filled 2019-03-15: qty 1

## 2019-03-15 MED ORDER — SUCCINYLCHOLINE CHLORIDE 200 MG/10ML IV SOSY
PREFILLED_SYRINGE | INTRAVENOUS | Status: AC
  Filled 2019-03-15: qty 10

## 2019-03-15 MED ORDER — CEFAZOLIN SODIUM-DEXTROSE 2-3 GM-%(50ML) IV SOLR
INTRAVENOUS | Status: DC | PRN
  Administered 2019-03-15: 2 g via INTRAVENOUS

## 2019-03-15 MED ORDER — ONDANSETRON HCL 4 MG/2ML IJ SOLN: 4.0000 mg | Freq: Three times a day (TID) | INTRAMUSCULAR | Status: DC | PRN

## 2019-03-15 MED ORDER — SOD CITRATE-CITRIC ACID 500-334 MG/5ML PO SOLN
ORAL | Status: AC
  Administered 2019-03-15: 08:00:00 30 mL
  Filled 2019-03-15: qty 15

## 2019-03-15 MED ORDER — TERBUTALINE SULFATE 1 MG/ML IJ SOLN
0.2500 mg | Freq: Once | INTRAMUSCULAR | Status: AC | PRN
  Administered 2019-03-15: 0.25 mg via SUBCUTANEOUS
  Filled 2019-03-15: qty 1

## 2019-03-15 MED ORDER — METHYLERGONOVINE MALEATE 0.2 MG/ML IJ SOLN
INTRAMUSCULAR | Status: AC
  Filled 2019-03-15: qty 1

## 2019-03-15 MED ORDER — ACETAMINOPHEN 325 MG PO TABS
650.0000 mg | ORAL_TABLET | ORAL | Status: DC | PRN
  Administered 2019-03-15: 01:00:00 650 mg via ORAL
  Filled 2019-03-15: qty 2

## 2019-03-15 MED ORDER — PROPOFOL 10 MG/ML IV BOLUS
INTRAVENOUS | Status: DC | PRN
  Administered 2019-03-15: 200 mg via INTRAVENOUS

## 2019-03-15 MED ORDER — KETOROLAC TROMETHAMINE 30 MG/ML IJ SOLN: 30.0000 mg | Freq: Four times a day (QID) | INTRAMUSCULAR | Status: DC | PRN

## 2019-03-15 MED ORDER — SODIUM CHLORIDE 0.9 % IV SOLN: INTRAVENOUS | Status: DC | PRN

## 2019-03-15 MED ORDER — WITCH HAZEL-GLYCERIN EX PADS: 1.0000 "application " | MEDICATED_PAD | CUTANEOUS | Status: DC | PRN

## 2019-03-15 MED ORDER — MENTHOL 3 MG MT LOZG
1.0000 | LOZENGE | OROMUCOSAL | Status: DC | PRN
  Filled 2019-03-15: qty 9

## 2019-03-15 MED ORDER — SENNOSIDES-DOCUSATE SODIUM 8.6-50 MG PO TABS
2.0000 | ORAL_TABLET | ORAL | Status: DC
  Administered 2019-03-15 – 2019-03-16 (×2): 2 via ORAL
  Filled 2019-03-15 (×3): qty 2

## 2019-03-15 MED ORDER — LACTATED RINGERS IV SOLN
INTRAVENOUS | Status: DC
  Administered 2019-03-15: 125 mL/h via INTRAVENOUS
  Administered 2019-03-15: 01:00:00 via INTRAVENOUS

## 2019-03-15 MED ORDER — HYDROMORPHONE HCL 1 MG/ML IJ SOLN
INTRAMUSCULAR | Status: AC
  Filled 2019-03-15: qty 0.5

## 2019-03-15 MED ORDER — MIDAZOLAM HCL 2 MG/2ML IJ SOLN
INTRAMUSCULAR | Status: AC
  Filled 2019-03-15: qty 2

## 2019-03-15 MED ORDER — LIDOCAINE 2% (20 MG/ML) 5 ML SYRINGE
INTRAMUSCULAR | Status: AC
  Filled 2019-03-15: qty 5

## 2019-03-15 MED ORDER — KETOROLAC TROMETHAMINE 30 MG/ML IJ SOLN
INTRAMUSCULAR | Status: AC
  Filled 2019-03-15: qty 1

## 2019-03-15 MED ORDER — PROMETHAZINE HCL 25 MG/ML IJ SOLN: 6.2500 mg | INTRAMUSCULAR | Status: DC | PRN

## 2019-03-15 MED ORDER — FENTANYL-BUPIVACAINE-NACL 0.5-0.125-0.9 MG/250ML-% EP SOLN
EPIDURAL | Status: AC
  Filled 2019-03-15: qty 250

## 2019-03-15 MED ORDER — SIMETHICONE 80 MG PO CHEW
80.0000 mg | CHEWABLE_TABLET | ORAL | Status: DC | PRN
  Administered 2019-03-16 – 2019-03-17 (×2): 80 mg via ORAL
  Filled 2019-03-15 (×3): qty 1

## 2019-03-15 MED ORDER — SODIUM CHLORIDE 0.9 % IV SOLN
5.0000 10*6.[IU] | Freq: Once | INTRAVENOUS | Status: AC
  Administered 2019-03-15: 5 10*6.[IU] via INTRAVENOUS
  Filled 2019-03-15: qty 5

## 2019-03-15 MED ORDER — DIBUCAINE (PERIANAL) 1 % EX OINT
1.0000 "application " | TOPICAL_OINTMENT | CUTANEOUS | Status: DC | PRN
  Filled 2019-03-15: qty 28

## 2019-03-15 MED ORDER — SODIUM CHLORIDE (PF) 0.9 % IJ SOLN
INTRAMUSCULAR | Status: DC | PRN
  Administered 2019-03-15: 12 mL/h via EPIDURAL

## 2019-03-15 MED ORDER — FENTANYL CITRATE (PF) 100 MCG/2ML IJ SOLN
INTRAMUSCULAR | Status: DC | PRN
  Administered 2019-03-15: 100 ug via EPIDURAL
  Administered 2019-03-15 (×2): 100 ug via INTRAVENOUS

## 2019-03-15 MED ORDER — PHENYLEPHRINE 40 MCG/ML (10ML) SYRINGE FOR IV PUSH (FOR BLOOD PRESSURE SUPPORT)
PREFILLED_SYRINGE | INTRAVENOUS | Status: AC
  Filled 2019-03-15: qty 10

## 2019-03-15 MED ORDER — MORPHINE SULFATE (PF) 0.5 MG/ML IJ SOLN
INTRAMUSCULAR | Status: DC | PRN
  Administered 2019-03-15: 2 mg via EPIDURAL

## 2019-03-15 MED ORDER — LACTATED RINGERS IV SOLN
INTRAVENOUS | Status: DC | PRN
  Administered 2019-03-15: 08:00:00 via INTRAVENOUS

## 2019-03-15 MED ORDER — DIPHENHYDRAMINE HCL 25 MG PO CAPS: 25.0000 mg | ORAL_CAPSULE | ORAL | Status: DC | PRN

## 2019-03-15 MED ORDER — OXYTOCIN 40 UNITS IN NORMAL SALINE INFUSION - SIMPLE MED
1.0000 m[IU]/min | INTRAVENOUS | Status: DC
  Administered 2019-03-15: 02:00:00 2 m[IU]/min via INTRAVENOUS
  Filled 2019-03-15: qty 1000

## 2019-03-15 MED ORDER — OXYTOCIN BOLUS FROM INFUSION: 500.0000 mL | Freq: Once | INTRAVENOUS | Status: DC

## 2019-03-15 MED ORDER — OXYTOCIN 40 UNITS IN NORMAL SALINE INFUSION - SIMPLE MED: 2.5000 [IU]/h | INTRAVENOUS | Status: AC

## 2019-03-15 MED ORDER — DIPHENHYDRAMINE HCL 50 MG/ML IJ SOLN: 12.5000 mg | INTRAMUSCULAR | Status: DC | PRN

## 2019-03-15 MED ORDER — PROPOFOL 10 MG/ML IV BOLUS
INTRAVENOUS | Status: AC
  Filled 2019-03-15: qty 20

## 2019-03-15 MED ORDER — FENTANYL CITRATE (PF) 250 MCG/5ML IJ SOLN
INTRAMUSCULAR | Status: AC
  Filled 2019-03-15: qty 5

## 2019-03-15 MED ORDER — LACTATED RINGERS IV SOLN
500.0000 mL | INTRAVENOUS | Status: DC | PRN
  Administered 2019-03-15: 500 mL via INTRAVENOUS

## 2019-03-15 MED ORDER — CEFAZOLIN SODIUM-DEXTROSE 2-4 GM/100ML-% IV SOLN
INTRAVENOUS | Status: AC
  Filled 2019-03-15: qty 100

## 2019-03-15 MED ORDER — DEXAMETHASONE SODIUM PHOSPHATE 10 MG/ML IJ SOLN
INTRAMUSCULAR | Status: DC | PRN
  Administered 2019-03-15: 8 mg via INTRAVENOUS

## 2019-03-15 MED ORDER — HYDROMORPHONE HCL 1 MG/ML IJ SOLN
0.5000 mg | INTRAMUSCULAR | Status: DC | PRN
  Administered 2019-03-15: 11:00:00 0.5 mg via INTRAVENOUS
  Administered 2019-03-15: 10:00:00 1 mg via INTRAVENOUS
  Administered 2019-03-15: 0.5 mg via INTRAVENOUS

## 2019-03-15 MED ORDER — PHENYLEPHRINE 40 MCG/ML (10ML) SYRINGE FOR IV PUSH (FOR BLOOD PRESSURE SUPPORT)
80.0000 ug | PREFILLED_SYRINGE | INTRAVENOUS | Status: DC | PRN
  Filled 2019-03-15: qty 10

## 2019-03-15 MED ORDER — KETOROLAC TROMETHAMINE 30 MG/ML IJ SOLN
30.0000 mg | Freq: Four times a day (QID) | INTRAMUSCULAR | Status: AC
  Administered 2019-03-15 – 2019-03-16 (×2): 30 mg via INTRAVENOUS
  Filled 2019-03-15 (×2): qty 1

## 2019-03-15 MED ORDER — TETANUS-DIPHTH-ACELL PERTUSSIS 5-2.5-18.5 LF-MCG/0.5 IM SUSP
0.5000 mL | Freq: Once | INTRAMUSCULAR | Status: DC
  Filled 2019-03-15: qty 0.5

## 2019-03-15 MED ORDER — ENOXAPARIN SODIUM 60 MG/0.6ML ~~LOC~~ SOLN
60.0000 mg | SUBCUTANEOUS | Status: DC
  Administered 2019-03-16: 60 mg via SUBCUTANEOUS
  Filled 2019-03-15 (×2): qty 0.6

## 2019-03-15 MED ORDER — ACETAMINOPHEN 160 MG/5ML PO SOLN: 1000.0000 mg | Freq: Once | ORAL | Status: DC

## 2019-03-15 MED ORDER — STERILE WATER FOR IRRIGATION IR SOLN
Status: DC | PRN
  Administered 2019-03-15: 1000 mL

## 2019-03-15 MED ORDER — SUCCINYLCHOLINE CHLORIDE 20 MG/ML IJ SOLN
INTRAMUSCULAR | Status: DC | PRN
  Administered 2019-03-15: 120 mg via INTRAVENOUS

## 2019-03-15 MED ORDER — PHENYLEPHRINE 40 MCG/ML (10ML) SYRINGE FOR IV PUSH (FOR BLOOD PRESSURE SUPPORT)
PREFILLED_SYRINGE | INTRAVENOUS | Status: DC | PRN
  Administered 2019-03-15 (×3): 80 ug via INTRAVENOUS

## 2019-03-15 MED ORDER — FENTANYL 40 MCG/ML IV SOLN
INTRAVENOUS | Status: DC
  Administered 2019-03-15: 14:00:00 via INTRAVENOUS
  Filled 2019-03-15: qty 1000

## 2019-03-15 MED ORDER — LIDOCAINE HCL (PF) 1 % IJ SOLN: 30.0000 mL | INTRAMUSCULAR | Status: DC | PRN

## 2019-03-15 MED ORDER — SODIUM BICARBONATE 8.4 % IV SOLN
INTRAVENOUS | Status: AC
  Filled 2019-03-15: qty 50

## 2019-03-15 MED ORDER — LACTATED RINGERS IV SOLN: INTRAVENOUS | Status: DC

## 2019-03-15 MED ORDER — OXYTOCIN 40 UNITS IN NORMAL SALINE INFUSION - SIMPLE MED: 2.5000 [IU]/h | INTRAVENOUS | Status: DC

## 2019-03-15 MED ORDER — NALOXONE HCL 4 MG/10ML IJ SOLN
1.0000 ug/kg/h | INTRAVENOUS | Status: DC | PRN
  Filled 2019-03-15: qty 5

## 2019-03-15 MED ORDER — ACETAMINOPHEN 500 MG PO TABS: 1000.0000 mg | ORAL_TABLET | Freq: Once | ORAL | Status: DC

## 2019-03-15 MED ORDER — MIDAZOLAM HCL 2 MG/2ML IJ SOLN
INTRAMUSCULAR | Status: DC | PRN
  Administered 2019-03-15: 2 mg via INTRAVENOUS

## 2019-03-15 MED ORDER — SODIUM CHLORIDE 0.9% FLUSH: 3.0000 mL | INTRAVENOUS | Status: DC | PRN

## 2019-03-15 MED ORDER — FENTANYL CITRATE (PF) 100 MCG/2ML IJ SOLN
25.0000 ug | INTRAMUSCULAR | Status: DC | PRN
  Administered 2019-03-15: 25 ug via INTRAVENOUS

## 2019-03-15 MED ORDER — SODIUM CHLORIDE 0.9 % IV SOLN
INTRAVENOUS | Status: DC | PRN
  Administered 2019-03-15: 08:00:00 via INTRAVENOUS

## 2019-03-15 MED ORDER — ACETAMINOPHEN 500 MG PO TABS: 1000.0000 mg | ORAL_TABLET | Freq: Four times a day (QID) | ORAL | Status: DC

## 2019-03-15 MED ORDER — ACETAMINOPHEN 325 MG PO TABS
650.0000 mg | ORAL_TABLET | ORAL | Status: DC | PRN
  Administered 2019-03-15 – 2019-03-17 (×5): 650 mg via ORAL
  Filled 2019-03-15 (×5): qty 2

## 2019-03-15 MED ORDER — KETOROLAC TROMETHAMINE 30 MG/ML IJ SOLN
30.0000 mg | Freq: Once | INTRAMUSCULAR | Status: AC
  Administered 2019-03-15: 30 mg via INTRAVENOUS

## 2019-03-15 SURGICAL SUPPLY — 35 items
ADH SKN CLS APL DERMABOND .7 (GAUZE/BANDAGES/DRESSINGS) ×2
CLAMP CORD UMBIL (MISCELLANEOUS) IMPLANT
CLOTH BEACON ORANGE TIMEOUT ST (SAFETY) ×3 IMPLANT
DERMABOND ADVANCED (GAUZE/BANDAGES/DRESSINGS) ×4
DERMABOND ADVANCED .7 DNX12 (GAUZE/BANDAGES/DRESSINGS) ×2 IMPLANT
DRSG OPSITE POSTOP 4X10 (GAUZE/BANDAGES/DRESSINGS) ×3 IMPLANT
ELECT REM PT RETURN 9FT ADLT (ELECTROSURGICAL) ×3
ELECTRODE REM PT RTRN 9FT ADLT (ELECTROSURGICAL) ×1 IMPLANT
GAUZE SPONGE 4X4 12PLY STRL LF (GAUZE/BANDAGES/DRESSINGS) ×4 IMPLANT
GLOVE BIOGEL PI IND STRL 7.0 (GLOVE) ×1 IMPLANT
GLOVE BIOGEL PI IND STRL 8 (GLOVE) ×1 IMPLANT
GLOVE BIOGEL PI INDICATOR 7.0 (GLOVE) ×2
GLOVE BIOGEL PI INDICATOR 8 (GLOVE) ×2
GLOVE ECLIPSE 8.0 STRL XLNG CF (GLOVE) ×3 IMPLANT
GOWN STRL REUS W/TWL LRG LVL3 (GOWN DISPOSABLE) ×6 IMPLANT
KIT ABG SYR 3ML LUER SLIP (SYRINGE) ×3 IMPLANT
NDL HYPO 18GX1.5 BLUNT FILL (NEEDLE) ×1 IMPLANT
NDL HYPO 25X5/8 SAFETYGLIDE (NEEDLE) ×1 IMPLANT
NEEDLE HYPO 18GX1.5 BLUNT FILL (NEEDLE) ×3 IMPLANT
NEEDLE HYPO 22GX1.5 SAFETY (NEEDLE) ×3 IMPLANT
NEEDLE HYPO 25X5/8 SAFETYGLIDE (NEEDLE) ×3 IMPLANT
NS IRRIG 1000ML POUR BTL (IV SOLUTION) ×3 IMPLANT
PACK C SECTION WH (CUSTOM PROCEDURE TRAY) ×3 IMPLANT
PAD ABD 8X7 1/2 STERILE (GAUZE/BANDAGES/DRESSINGS) ×2 IMPLANT
PAD OB MATERNITY 4.3X12.25 (PERSONAL CARE ITEMS) ×3 IMPLANT
PENCIL SMOKE EVAC W/HOLSTER (ELECTROSURGICAL) ×3 IMPLANT
SUT CHROMIC 0 CT 1 (SUTURE) ×3 IMPLANT
SUT MNCRL 0 VIOLET CTX 36 (SUTURE) ×2 IMPLANT
SUT MONOCRYL 0 CTX 36 (SUTURE) ×4
SUT VIC AB 0 CTX 36 (SUTURE) ×3
SUT VIC AB 0 CTX36XBRD ANBCTRL (SUTURE) ×1 IMPLANT
SYR 20CC LL (SYRINGE) ×6 IMPLANT
TAPE CLOTH SURG 4X10 WHT LF (GAUZE/BANDAGES/DRESSINGS) ×2 IMPLANT
TOWEL OR 17X24 6PK STRL BLUE (TOWEL DISPOSABLE) ×3 IMPLANT
WATER STERILE IRR 1000ML POUR (IV SOLUTION) ×3 IMPLANT

## 2019-03-15 NOTE — Op Note (Signed)
Sandra Banks PROCEDURE DATE: 03/15/2019  PREOPERATIVE DIAGNOSES: Intrauterine pregnancy at 6482w0d weeks gestation; fetal bradycardia  POSTOPERATIVE DIAGNOSES: Intrauterine pregnancy at 7482w0d weeks gestation; Placental abruption  PROCEDURE: Repeat Low Transverse Cesarean Section  SURGEON:  Dr. Duane LopeLuther Eure ASSISTANT:  Dr. Rhett BannisterLaurel Amina Menchaca  ANESTHESIOLOGY TEAM: Anesthesiologist: Kaylyn LayerHowze, Kathryn E, MD CRNA: Algis GreenhouseBurger, Linda A, CRNA  INDICATIONS: Sandra Banks is a 38 y.o. [id number] at 1582w0d here for cesarean section secondary to the indications listed under preoperative diagnoses; please see preoperative note for further details.  The risks of cesarean section were discussed with the patient including but were not limited to: bleeding which may require transfusion or reoperation; infection which may require antibiotics; injury to bowel, bladder, ureters or other surrounding organs; injury to the fetus; need for additional procedures including hysterectomy in the event of a life-threatening hemorrhage; placental abnormalities wth subsequent pregnancies, incisional problems, thromboembolic phenomenon and other postoperative/anesthesia complications.   The patient concurred with the proposed plan, giving informed written consent for the procedure.    FINDINGS:  Viable female infant in cephalic presentation.  Apgars 2, 5 and 7. ABG below readable range. Full-thickness facial laceration to infant, hemostatic. Clear amniotic fluid.  Intact placenta, three vessel cord with blood clots noted behind placenta consistent with abruption.  Normal uterus, fallopian tubes and ovaries bilaterally. Summary of Events Leading to C/S: 0814: Dr. Despina HiddenEure called to L&D room for FHR into 70s. [id number]: STAT C/S called for persistent fetal bradycardia. [id number]: Out of room. [id number]: In OR 0825: FSE off, FHR 96. Inadequate epidural on check, converted to general anesthesia. [id number]: Incision 0828: Time of birth.   ANESTHESIA:  Epidural converted to general anesthesia for inadequate analgesia in setting of STAT C/S INTRAVENOUS FLUIDS: 1200 ml   ESTIMATED BLOOD LOSS: 499 ml URINE OUTPUT:  150 ml SPECIMENS: Placenta sent to pathology COMPLICATIONS: Placental abruption  full-thickness laceration to infant cheek repaired with dermabond by Dr. Katrinka BlazingSmith in recovery room   PROCEDURE IN DETAIL:  The patient preoperatively received intravenous antibiotics and had sequential compression devices applied to her lower extremities. She was then placed in a dorsal supine position with a leftward tilt, and a foley catheter was already in place in her bladder. Brief timeout performed, patient prepped with iodine and draped in a sterile manner. Epidural anesthesia was found to be inadequate, and general anesthesia was administered. A  Pfannenstiel skin incision was made with scalpel and carried through to the underlying layer of muscle then extended bilaterally bluntly. The rectus muscles were separated in the midline and the peritoneum was entered bluntly.  Attention was turned to the lower uterine segment where a low transverse hysterotomy was made with a scalpel and extended bilaterally bluntly.  The infant was successfully delivered, a hemostatic full-thickness facial laceration was noted on cheek, the cord was clamped and cut immediately, and the infant was handed over to the awaiting neonatology team. Uterine massage was then administered, and the placenta delivered intact with a three-vessel cord with multiple clots with it. The uterus was exteriorized and then it was then cleared of clots and debris. Postpartum pitocin running but IM methergine requested to be given for poor tone.  The hysterotomy was closed with 0 Monocryl in a running locked fashion, and an imbricating layer was also placed with 0 Monocryl.   The pelvis was cleared of all clot and debris. Uterine tone was improved. Hemostasis was confirmed on all surfaces.  The uterus was  replaced within the abdomen.  The peritoneum and  rectus muscles were closed with a single Chromic interrupted stitch.  The fascia was then closed using 0 Vicryl in a running fashion.  The subcutaneous layer was irrigated, reapproximated with 2-0 plain gut interrupted stitches, and the skin was closed with a 4-0 Vicryl subcuticular stitch. The patient tolerated the procedure well. Sponge, instrument and needle counts were correct x 3.  She was taken to the recovery room in stable condition.   Lambert Mody. Juleen China, DO Ob/Gyn Fellow, [place] for [employer], [place]

## 2019-03-15 NOTE — Anesthesia Postprocedure Evaluation (Signed)
Anesthesia Post Note  Patient: Sandra Banks  Procedure(s) Performed: CESAREAN SECTION (N/A Abdomen)     Patient location during evaluation: PACU Anesthesia Type: General Level of consciousness: awake and alert Pain management: pain level controlled Vital Signs Assessment: post-procedure vital signs reviewed and stable Respiratory status: spontaneous breathing, nonlabored ventilation and respiratory function stable Cardiovascular status: blood pressure returned to baseline and stable Postop Assessment: no apparent nausea or vomiting and epidural receding Anesthetic complications: no    Last Vitals:  Vitals:   03/15/19 1130 03/15/19 1145  BP: (!) 143/101 134/88  Pulse: (!) 59 64  Resp: 15 17  Temp: 36.9 C   SpO2: 100% 99%                 Epidural/Spinal Function Cutaneous sensation: Tingles (03/15/19 1130), Patient able to flex knees: Yes (03/15/19 1130), Patient able to lift hips off bed: Yes (03/15/19 1130), Back pain beyond tenderness at insertion site: No (03/15/19 1130), Progressively worsening motor and/or sensory loss: No (03/15/19 1130), Bowel and/or bladder incontinence post epidural: No (03/15/19 1130)  Brennan Bailey

## 2019-03-15 NOTE — Telephone Encounter (Signed)
Late Entry note for 03/14/19: TC to inform patient of lab results from 03/12/19 visit. Dx of PEC given with P/C ratio of 345. With patient's complaints of increasing swelling in BLE and elevated BPs in office, Recommend IOL at 37 wks for PEC. Discussed the risk of seizures, abruption, fetal or maternal death with worsening condition being some of the negative outcomes of not inducing labor at 37 wks. Patient verbalized an understanding and requests to be induced at midnight on 03/15/19.  IOL scheduled. Orders placed.  [name], CNM

## 2019-03-15 NOTE — Progress Notes (Signed)
CTSP for fetal deceleration  By the time I got there fetus in the 90s for close to 59m, mom in hands and knees, terbutaline given and pitocin already off I put her in dorsal lithotomy and SVE 2/thick/high. I arom'ed for clear fluid and placed FSE and FHR normal and min to mod variability and no decel after 73m and no accels.  Recommend pit break and fetal status reassuring after 26m or so then recommend epidural placement. Pt amenable to this  Durene Romans MD Attending Center for [employer] (Faculty Practice) 03/15/2019 Time: [phone]

## 2019-03-15 NOTE — Progress Notes (Signed)
Pt request catheter and PCA because DC pt does not want to be connected to CO2 monitor anymore. States its all too much to go see baby in NICU. Pt verbalized understanding that after PCA DC she will be on oral medications only. Pt understands and still wants everything DC. Pt walking around room after pca and foley DC. No distress noted denies needs.   Trula Ore RN

## 2019-03-15 NOTE — Progress Notes (Signed)
RN called stating patient is requesting PCA to be discontinued and desires oral medication. Patient is ambulatory, tolerating PO, and VSS. Will d/c PCA. Patient has active orders for PO  pain medication already.   Wende Mott, CNM 03/15/19 9:06 PM

## 2019-03-15 NOTE — Anesthesia Procedure Notes (Signed)
Procedure Name: Intubation Date/Time: 03/15/2019 9:27 AM Performed by: Asher Muir, CRNA Pre-anesthesia Checklist: Patient identified, Emergency Drugs available, Suction available and Patient being monitored Patient Re-evaluated:Patient Re-evaluated prior to induction Oxygen Delivery Method: Circle system utilized Preoxygenation: Pre-oxygenation with 100% oxygen Induction Type: IV induction and Cricoid Pressure applied Ventilation: Mask ventilation without difficulty Laryngoscope Size: 3 and Glidescope (Lopro) Grade View: Grade II Tube size: 7.0 mm Number of attempts: 1 Airway Equipment and Method: Video-laryngoscopy and Stylet Placement Confirmation: ETT inserted through vocal cords under direct vision,  positive ETCO2 and breath sounds checked- equal and bilateral Secured at: 20 cm Tube secured with: Tape Dental Injury: Teeth and Oropharynx as per pre-operative assessment

## 2019-03-15 NOTE — Discharge Summary (Signed)
Obstetrics Discharge Summary OB/GYN Faculty Practice   Patient Name: Sandra Banks DOB: 1981/04/12 MRN: [id number]  Date of admission: 03/14/2019 Delivering MD: Duane LopeEURE, LUTHER H   Date of discharge: 03/17/2019  Admitting diagnosis: Pregnancy Intrauterine pregnancy: 4450w0d     Secondary diagnosis:   Principal Problem:   Preeclampsia, third trimester Active Problems:   History of cesarean delivery, currently pregnant   History of pre-eclampsia in prior pregnancy, currently pregnant, unspecified trimester   History of preterm delivery, currently pregnant, unspecified trimester   GBS bacteriuria   Late prenatal care in third trimester   Anemia affecting pregnancy in third trimester   Cocaine abuse (HCC)   Status post cesarean delivery    Discharge diagnosis: Term Pregnancy Delivered                                            Postpartum procedures: Inpatient Nexplanon: patient declined. 2 units PRBC's Complications: STAT Cesarean section for fetal bradycardia, placental abruption, blood loss requiring blood transfusion.   Outpatient Follow-Up: [ ]  incision check  [ ]  BP check   Hospital course: Sandra DadLakorya M Conde is a 38 y.o. 1950w0d who was admitted for induction of labor for preeclampsia. Her pregnancy was complicated by above noted. Her labor course was notable for admission with SVE fingertip, started on pitocin. NRFHR did not respond with interventions and concerning for placental abruption given contraction pattern, positive cocaine use on admission, and preeclampsia. STAT Cesarean section was called for persistent fetal bradycardia and placental abruption diagnosed clinically intraoperatively. Please see delivery/op note for additional details. Her HgB trended from 8.5 to 5.9 to 7.4 at the time of discharge. Her postpartum course complicated by elevated BP, blood loss.   She was bottle/ breast feeding without difficult. By day of discharge, she was passing flatus, urinating, eating  and drinking without difficulty. Her pain was well-controlled, and she was discharged home with small number of oxycodone and ibuprofen. She will follow-up in clinic in 1 week for BP check. No HA or scotoma.   Physical exam  Vitals:   03/16/19 1544 03/16/19 1749 03/16/19 2200 03/17/19 0610  BP: 121/76 (!) 142/93 (!) 152/95 (!) 137/91  Pulse: 65 68 66 66  Resp: 16 19 20 16   Temp: 98.3 F (36.8 C) 97.6 F (36.4 C) 98.6 F (37 C) 98.8 F (37.1 C)  TempSrc: Oral Oral Oral   SpO2: 100% 100% 100%   Weight:      Height:       General: Alert, appropriate  Lochia: appropriate Uterine Fundus: soft Incision: Dressing is clean, dry, and intact DVT Evaluation: No evidence of DVT seen on physical exam.  Labs: Lab Results  Component Value Date   WBC 12.0 (H) 03/16/2019   HGB 7.4 (L) 03/16/2019   HCT 22.9 (L) 03/16/2019   MCV 75.4 (L) 03/16/2019   PLT 119 (L) 03/16/2019   CMP Latest Ref Rng & Units 03/15/2019  Glucose 70 - 99 mg/dL 97  BUN 6 - 20 mg/dL 6  Creatinine 4.090.44 - 8.111.00 mg/dL 9.140.54  Sodium 782135 - 956145 mmol/L 136  Potassium 3.5 - 5.1 mmol/L 3.1(L)  Chloride 98 - 111 mmol/L 108  CO2 22 - 32 mmol/L 20(L)  Calcium 8.9 - 10.3 mg/dL 2.1(H8.4(L)  Total Protein 6.5 - 8.1 g/dL 6.4(L)  Total Bilirubin 0.3 - 1.2 mg/dL 0.4  Alkaline Phos 38 - 126 U/L  147(H)  AST 15 - 41 U/L 15  ALT 0 - 44 U/L 8    Discharge instructions: Per After Visit Summary and "Baby and Me Booklet"  After visit meds:  Allergies as of 03/17/2019   No Known Allergies     Medication List    TAKE these medications   amLODipine 5 MG tablet Commonly known as: NORVASC Take 1 tablet (5 mg total) by mouth daily.   Blood Pressure Monitor Automat Devi Appropriate size cuff (forearm circumference) with automatic BP monitor. To be monitored regularly at home. ICD-10 code: O93.90, Supervision of high risk pregnancy.   ferrous sulfate 325 (65 FE) MG tablet Take 1 tablet (325 mg total) by mouth daily.   ibuprofen 800 MG  tablet Commonly known as: ADVIL Take 1 tablet (800 mg total) by mouth every 6 (six) hours.   oxyCODONE 5 MG immediate release tablet Commonly known as: Oxy IR/ROXICODONE Take 1 tablet (5 mg total) by mouth every 6 (six) hours as needed for severe pain.       Postpartum contraception: Nexplanon: refused in-hospital placement.  Diet: Routine Diet Activity: Advance as tolerated. Pelvic rest for 6 weeks.   Follow-up Appt: Future Appointments  Date Time Provider [place]  04/02/2019  3:30 PM [place] None  04/29/2019  1:30 PM [name], CNM [facility] None   Follow-up Visit:No follow-ups on file. Please schedule this patient for Postpartum visit in: 4 weeks with the following provider: Any provider For C/S patients schedule nurse incision check in weeks 2 weeks: yes High risk pregnancy complicated by: preeclampsia, TOLAC, cocaine use. Needs 1 week BP check. Message sent to office.  Delivery mode:  CS Anticipated Birth Control:  Nexplanon PP Procedures needed: BP check, incision check Schedule Integrated BH visit: yes  Newborn Data: Live born female  Birth Weight: 7 lb 0.1 oz (3177 g) APGAR: 2, 5  Newborn Delivery   Birth date/time: 03/15/2019 08:28:00 Delivery type: C-Section, Low Transverse Trial of labor: Yes C-section categorization: Repeat      Baby Feeding: Bottle Disposition:NICU    Dariel Pellecchia, Artist Pais, NP 03/17/2019 9:48 AM

## 2019-03-15 NOTE — Anesthesia Procedure Notes (Signed)
Epidural Patient location during procedure: OB Start time: 03/15/2019 7:16 AM End time: 03/15/2019 7:19 AM  Staffing Anesthesiologist: Brennan Bailey, MD Performed: anesthesiologist   Preanesthetic Checklist Completed: patient identified, pre-op evaluation, timeout performed, IV checked, risks and benefits discussed and monitors and equipment checked  Epidural Patient position: sitting Prep: site prepped and draped and DuraPrep Patient monitoring: continuous pulse ox, blood pressure, heart rate and cardiac monitor Approach: midline Location: L3-L4 Injection technique: LOR air  Needle:  Needle type: Tuohy  Needle gauge: 17 G Needle length: 9 cm Needle insertion depth: 7 cm Catheter type: closed end flexible Catheter size: 19 Gauge Catheter at skin depth: 12 cm Test dose: negative and Other (1% lidocaine)  Assessment Events: blood not aspirated, injection not painful, no injection resistance, negative IV test and no paresthesia  Additional Notes Patient identified. Risks, benefits, and alternatives discussed with patient including but not limited to bleeding, infection, nerve damage, paralysis, failed block, incomplete pain control, headache, blood pressure changes, nausea, vomiting, reactions to medication, itching, and postpartum back pain. Confirmed with bedside nurse the patient's most recent platelet count. Confirmed with patient that they are not currently taking any anticoagulation, have any bleeding history, or any family history of bleeding disorders. Patient expressed understanding and wished to proceed. All questions were answered. Sterile technique was used throughout the entire procedure. Crisp LOR on first pass. Please see nursing notes for vital signs. Test dose was given through epidural catheter and negative prior to continuing to dose epidural or start infusion. Warning signs of high block given to the patient including shortness of breath, tingling/numbness in  hands, complete motor block, or any concerning symptoms with instructions to call for help. Patient was given instructions on fall risk and not to get out of bed. All questions and concerns addressed with instructions to call with any issues or inadequate analgesia.  Reason for block:procedure for pain

## 2019-03-15 NOTE — Anesthesia Preprocedure Evaluation (Addendum)
Anesthesia Evaluation  Patient identified by MRN, date of birth, ID band Patient awake    Reviewed: Allergy & Precautions, Patient's Chart, lab work & pertinent test results  History of Anesthesia Complications Negative for: history of anesthetic complications  Airway Mallampati: II  TM Distance: >3 FB Neck ROM: Full    Dental no notable dental hx.    Pulmonary former smoker,    Pulmonary exam normal        Cardiovascular hypertension, Normal cardiovascular exam     Neuro/Psych negative neurological ROS     GI/Hepatic negative GI ROS, (+)     substance abuse  cocaine use,   Endo/Other  negative endocrine ROS  Renal/GU negative Renal ROS     Musculoskeletal negative musculoskeletal ROS (+)   Abdominal   Peds  Hematology  (+) anemia , Hgb 8.5   Anesthesia Other Findings Day of surgery medications reviewed with the patient.  Reproductive/Obstetrics (+) Pregnancy PreE, Hx of C/S in 2005 followed by multiple successful VBACs                            Anesthesia Physical Anesthesia Plan  ASA: II  Anesthesia Plan: Epidural   Post-op Pain Management:    Induction:   PONV Risk Score and Plan: Treatment may vary due to age or medical condition  Airway Management Planned: Natural Airway  Additional Equipment:   Intra-op Plan:   Post-operative Plan:   Informed Consent: I have reviewed the patients History and Physical, chart, labs and discussed the procedure including the risks, benefits and alternatives for the proposed anesthesia with the patient or authorized representative who has indicated his/her understanding and acceptance.       Plan Discussed with:   Anesthesia Plan Comments:         Anesthesia Quick Evaluation

## 2019-03-15 NOTE — Transfer of Care (Signed)
Immediate Anesthesia Transfer of Care Note  Patient: Sandra Banks  Procedure(s) Performed: CESAREAN SECTION (N/A Abdomen)  Patient Location: PACU  Anesthesia Type:General  Level of Consciousness: sedated  Airway & Oxygen Therapy: Patient Spontanous Breathing and Patient connected to nasal cannula oxygen  Post-op Assessment: Report given to RN  Post vital signs: Reviewed and stable  Last Vitals:  Vitals Value Taken Time  BP    Temp    Pulse 71 03/15/19 0925  Resp 23 03/15/19 0925  SpO2 100 % 03/15/19 0925  Vitals shown include unvalidated device data.  Last Pain:  Vitals:   03/15/19 0800  TempSrc:   PainSc: Asleep         Complications: No apparent anesthesia complications

## 2019-03-16 ENCOUNTER — Encounter (HOSPITAL_COMMUNITY): Payer: Self-pay | Admitting: *Deleted

## 2019-03-16 ENCOUNTER — Encounter (HOSPITAL_COMMUNITY): Payer: Medicaid Other

## 2019-03-16 DIAGNOSIS — Z98891 History of uterine scar from previous surgery: Secondary | ICD-10-CM

## 2019-03-16 LAB — CBC
HCT: 19 % — ABNORMAL LOW (ref 36.0–46.0)
Hemoglobin: 5.9 g/dL — CL (ref 12.0–15.0)
MCH: 23.4 pg — ABNORMAL LOW (ref 26.0–34.0)
MCHC: 31.1 g/dL (ref 30.0–36.0)
MCV: 75.4 fL — ABNORMAL LOW (ref 80.0–100.0)
Platelets: 119 10*3/uL — ABNORMAL LOW (ref 150–400)
RBC: 2.52 MIL/uL — ABNORMAL LOW (ref 3.87–5.11)
RDW: 16.2 % — ABNORMAL HIGH (ref 11.5–15.5)
WBC: 12 10*3/uL — ABNORMAL HIGH (ref 4.0–10.5)
nRBC: 0 % (ref 0.0–0.2)

## 2019-03-16 LAB — HEMOGLOBIN AND HEMATOCRIT, BLOOD
HCT: 22.9 % — ABNORMAL LOW (ref 36.0–46.0)
Hemoglobin: 7.4 g/dL — ABNORMAL LOW (ref 12.0–15.0)

## 2019-03-16 LAB — ABO/RH: ABO/RH(D): O POS

## 2019-03-16 LAB — PREPARE RBC (CROSSMATCH)

## 2019-03-16 MED ORDER — SODIUM CHLORIDE 0.9% IV SOLUTION: Freq: Once | INTRAVENOUS | Status: DC

## 2019-03-16 MED ORDER — LACTATED RINGERS IV SOLN: INTRAVENOUS | Status: DC

## 2019-03-16 MED ORDER — SODIUM CHLORIDE 0.9 % IV SOLN
510.0000 mg | Freq: Once | INTRAVENOUS | Status: DC
  Filled 2019-03-16: qty 17

## 2019-03-16 MED ORDER — PNEUMOCOCCAL VAC POLYVALENT 25 MCG/0.5ML IJ INJ: 0.5000 mL | INJECTION | INTRAMUSCULAR | Status: DC

## 2019-03-16 NOTE — Progress Notes (Signed)
Received call from Maryelizabeth Kaufmann (CNM) regarding dischage plan for patient. Patient was informed together with charge RN that discharge will not be formally ordered tonight. Patient very upset stating " I was being lied to, I was told during the day by everybody that I can go home as soon as I get the blood".Charge nurse explained several options to patient including AMA but patient refused to sign. Per charge nurse, Maryelizabeth Kaufmann will be back to talk to patient.

## 2019-03-16 NOTE — Progress Notes (Signed)
Subjective: Postpartum Day 1: Cesarean Delivery Patient reports incisional pain, tolerating PO, + flatus and no problems voiding.    Objective: Vital signs in last 24 hours: Temp:  [94.6 F (34.8 C)-98.6 F (37 C)] 98.6 F (37 C) (07/13 0521) Pulse Rate:  [49-76] 71 (07/13 0521) Resp:  [15-24] 16 (07/13 0521) BP: (130-168)/(79-101) 130/82 (07/13 0521) SpO2:  [97 %-100 %] 100 % (07/13 0521)  Physical Exam:  General: alert, cooperative and no distress Lochia: appropriate Uterine Fundus: firm Incision: no significant drainage, pressure dressing removed, honeycomb clean, dry, intact DVT Evaluation: No evidence of DVT seen on physical exam. Negative Homan's sign. No cords or calf tenderness. No significant calf/ankle edema.  Recent Labs    03/15/19 1432 03/16/19 0455  HGB 7.8* 5.9*  HCT 24.6* 19.0*    Assessment/Plan: Status post Cesarean section. Doing well postoperatively. Severe anemia. Pt with hgb 5.9 from admission lab of 8.5. Pt denies any symptoms other than postop abdominal pain that is well controlled with PO medications.  PRBCs recommended to prevent complications postop.  Pt declines PRBCs initially and would prefer IV iron infusion only. With fereheme infusion, pt to stay overnight for follow up CBC, physical assessment tomorrow.  Pt prefers discharge today and option of giving blood and discharge if stable discussed. Pt agrees to 2 units PRBCs and if stable, discharge home after blood administration.   PRBCs ordered.  Sandra Banks 03/16/2019, 10:10 AM

## 2019-03-16 NOTE — Progress Notes (Signed)
Subjective: Postpartum Day 1: Cesarean Delivery 03/15/19 at 0828 Patient reports frustration with her desire to be discharged this evening. She states she was "promised" by another Faculty Provider that she would be discharged as soon as she completed her blood transfusion. She denies questions or concerns and states she has been assured by multiple staff members that she would be discharged as soon as labor and delivery team was available to place orders.  Objective: Vital signs in last 24 hours: Temp:  [97.6 F (36.4 C)-98.6 F (37 C)] 97.6 F (36.4 C) (07/13 1749) Pulse Rate:  [56-76] 68 (07/13 1749) Resp:  [16-20] 19 (07/13 1749) BP: (116-142)/(69-93) 142/93 (07/13 1749) SpO2:  [97 %-100 %] 100 % (07/13 1749)  Physical Exam: Declined by patient   Recent Labs    03/16/19 0455 03/16/19 2041  HGB 5.9* 7.4*  HCT 19.0* 22.9*   Patient Vitals for the past 24 hrs:  BP Temp Temp src Pulse Resp SpO2  03/16/19 1749 (!) 142/93 97.6 F (36.4 C) Oral 68 19 100 %  03/16/19 1544 121/76 98.3 F (36.8 C) Oral 65 16 100 %  03/16/19 1459 116/69 97.7 F (36.5 C) Oral 63 16 100 %  03/16/19 1305 135/84 98.2 F (36.8 C) Oral 75 18 97 %  03/16/19 1234 137/87 98.4 F (36.9 C) Oral 70 17 100 %  03/16/19 0521 130/82 98.6 F (37 C) Oral 71 16 100 %  03/16/19 0307 139/81 98.4 F (36.9 C) Oral (!) 56 20 100 %  03/15/19 2317 138/79 97.6 F (36.4 C) Oral 76 18 100 %    Assessment/Plan: At bedside due to call from RN regarding patient's requested discharge tonight. Explained to patient that her blood administration and post transfusion H and H were significantly delayed from original planned administration time. One complicating factor was patient's absence from unit. She also continues to have elevated blood pressures. Patient's chart does not have formal LCSW final report in chart. Attempted to clarify with patient that she will not be formally discharged this evening due to these incomplete  milestones but she is a candidate for formal discharge tomorrow morning if stable overnight. Confirmed with patient that she may coordinate leaving AMA with her bedside RN. Discussed with Dr. Rosana Hoes, who agrees with my plan of care. Daisy RN and postpartum charge nurse made aware of my initial conversation with patient and subsequent conversation with Dr. Rosana Hoes.  Darlina Rumpf, CNM 03/16/2019, 10:07 PM

## 2019-03-16 NOTE — Clinical Social Work Maternal (Signed)
  CLINICAL SOCIAL WORK MATERNAL/CHILD NOTE  Patient Details  Name: Sandra Banks MRN: [id number] Date of Birth: 07-27-1981  Date:  03/16/2019  Clinical Social Worker Initiating Note:  Laurey Arrow Date/Time: Initiated:  03/16/19/1000     Child's Name:  Sandra Banks   Biological Parents:  Mother, Father(Per MOB, FOB is currently incarcerated Sandra Banks 11/03/1998).)   Need for Interpreter:  None   Reason for Referral:  Late or No Prenatal Care , Current Substance Use/Substance Use During Pregnancy    Address:  [address] [place] [address]    Phone number:  [phone] (home)     Additional phone number:   Household Members/Support Persons (HM/SP):   Household Member/Support Person 1, Household Member/Support Person 2, Household Member/Support Person 3, Household Member/Support Person 4, Household Member/Support Person 5, Household Member/Support Person 6   HM/SP Name Relationship DOB or Age  HM/SP -1 Sandra Banks daughter 07/09/04  HM/SP -2 Sandra Banks son 03/05/06  HM/SP -3 Sandra Banks daughter 03/29/07  HM/SP -4 Sandra Banks daughter 07/19/2010  HM/SP -5 Sandra Banks daughter 02/24/2013  HM/SP -6 Sandra Banks son 08/16/15  HM/SP -7        HM/SP -8          Natural Supports (not living in the home):  Parent, Spouse/significant other, Immediate Family, Extended Family   Professional Supports: Therapist   Employment: Unemployed   Type of Work:     Education:  [place] arranged:    Museum/gallery curator Resources:  Medicaid   Other Resources:  Theatre stage manager Considerations Which May Impact Care:  None Reported  Strengths:  Ability to meet basic needs , Engineer, materials, Home prepared for child    Psychotropic Medications:         Pediatrician:       Pediatrician List:   [facility] ([facility]  [place]       Pediatrician Fax Number:    Risk Factors/Current Problems:      Cognitive State:  Able to Concentrate , Linear Thinking , Insightful , Alert    Mood/Affect:  Interested , Calm , Apprehensive , Relaxed , Comfortable    CSW Assessment:   CSW Plan/Description:       Sandra Chittum D BOYD-GILYARD, LCSW 03/16/2019, 1:03 PM

## 2019-03-17 LAB — TYPE AND SCREEN
ABO/RH(D): O POS
Antibody Screen: NEGATIVE
Unit division: 0
Unit division: 0

## 2019-03-17 LAB — BPAM RBC
Blood Product Expiration Date: 202007292359
Blood Product Expiration Date: 202008082359
ISSUE DATE / TIME: 202007131227
ISSUE DATE / TIME: 202007131514
Unit Type and Rh: 5100
Unit Type and Rh: 5100

## 2019-03-17 MED ORDER — IBUPROFEN 800 MG PO TABS: 800.0000 mg | ORAL_TABLET | Freq: Four times a day (QID) | ORAL | 0 refills | Status: DC

## 2019-03-17 MED ORDER — AMLODIPINE BESYLATE 5 MG PO TABS: 5.0000 mg | ORAL_TABLET | Freq: Every day | ORAL | 1 refills | Status: DC

## 2019-03-17 MED ORDER — OXYCODONE HCL 5 MG PO TABS: 5.0000 mg | ORAL_TABLET | Freq: Four times a day (QID) | ORAL | 0 refills | Status: AC | PRN

## 2019-03-17 MED ORDER — AMLODIPINE BESYLATE 10 MG PO TABS: 10.0000 mg | ORAL_TABLET | Freq: Every day | ORAL | 0 refills | Status: DC

## 2019-03-17 MED ORDER — AMLODIPINE BESYLATE 10 MG PO TABS: 10.0000 mg | ORAL_TABLET | Freq: Every day | ORAL | 0 refills | Status: AC

## 2019-03-17 MED ORDER — ETONOGESTREL 68 MG ~~LOC~~ IMPL
68.0000 mg | DRUG_IMPLANT | Freq: Once | SUBCUTANEOUS | Status: DC
  Filled 2019-03-17: qty 1

## 2019-03-17 MED ORDER — AMLODIPINE BESYLATE 10 MG PO TABS: 10.0000 mg | ORAL_TABLET | Freq: Every day | ORAL | 1 refills | Status: DC

## 2019-03-17 MED ORDER — OXYCODONE HCL 5 MG PO TABS: 5.0000 mg | ORAL_TABLET | Freq: Four times a day (QID) | ORAL | 0 refills | Status: DC | PRN

## 2019-03-17 MED ORDER — AMLODIPINE BESYLATE 5 MG PO TABS
10.0000 mg | ORAL_TABLET | Freq: Once | ORAL | Status: AC
  Administered 2019-03-17: 10:00:00 10 mg via ORAL
  Filled 2019-03-17: qty 2

## 2019-03-17 MED ORDER — IBUPROFEN 800 MG PO TABS: 800.0000 mg | ORAL_TABLET | Freq: Four times a day (QID) | ORAL | 0 refills | Status: AC

## 2019-03-17 MED ORDER — LIDOCAINE HCL 1 % IJ SOLN
0.0000 mL | Freq: Once | INTRAMUSCULAR | Status: DC | PRN
  Filled 2019-03-17: qty 20

## 2019-03-17 MED FILL — AMLODIPINE BESYLATE 10 MG T: 10 | 30 days supply | Qty: 30 | Fill #0

## 2019-03-17 NOTE — Progress Notes (Signed)
CSW aware MOB was assessed by CSW A. Boyd-Gilyard yesterday, 7/13, and that CSW made [place] CPS report. CSW spoke to Pam with [place] CPS who informed CSW that report has been assigned to Stephanie Bynum. CSW has attempted to reach out to Stephanie regarding report. CSW left voicemail for return call.   Ada Holness, LCSWA  [facility] [phone]  

## 2019-03-17 NOTE — Progress Notes (Signed)
Rounding attempted. Patient stable and sleeping well overnight. Patient intermittently awake but not responding to posed questions and unable to stay awake for assessment. Confirmed with Dr. Rosana Hoes that patient requires formal LCSW consult with final report before discharge can be ordered. RN notified.   Mallie Snooks, CNM 03/17/19 7:07 AM

## 2019-03-17 NOTE — Anesthesia Postprocedure Evaluation (Signed)
Anesthesia Post Note  Patient: Sandra Banks  Procedure(s) Performed: CESAREAN SECTION (N/A Abdomen)     Patient location during evaluation: Mother Baby Anesthesia Type: Epidural Level of consciousness: awake and alert Pain management: pain level controlled Vital Signs Assessment: post-procedure vital signs reviewed and stable Respiratory status: spontaneous breathing, nonlabored ventilation and respiratory function stable Cardiovascular status: stable Postop Assessment: no headache, no backache and epidural receding Anesthetic complications: no    Last Vitals:  Vitals:   03/16/19 2200 03/17/19 0610  BP: (!) 152/95 (!) 137/91  Pulse: 66 66  Resp: 20 16  Temp: 37 C 37.1 C  SpO2: 100%     Last Pain:  Vitals:   03/17/19 0615  TempSrc:   PainSc: Asleep   Pain Goal:                   Aidynn Krenn

## 2019-03-17 NOTE — Addendum Note (Signed)
Addendum  created 03/17/19 0816 by Ignacia Bayley, CRNA   Clinical Note Signed

## 2019-03-17 NOTE — Discharge Instructions (Signed)
Anemia  Anemia is a condition in which you do not have enough red blood cells or hemoglobin. Hemoglobin is a substance in red blood cells that carries oxygen. When you do not have enough red blood cells or hemoglobin (are anemic), your body cannot get enough oxygen and your organs may not work properly. As a result, you may feel very tired or have other problems. What are the causes? Common causes of anemia include:  Excessive bleeding. Anemia can be caused by excessive bleeding inside or outside the body, including bleeding from the intestine or from periods in women.  Poor nutrition.  Long-lasting (chronic) kidney, thyroid, and liver disease.  Bone marrow disorders.  Cancer and treatments for cancer.  HIV (human immunodeficiency virus) and AIDS (acquired immunodeficiency syndrome).  Treatments for HIV and AIDS.  Spleen problems.  Blood disorders.  Infections, medicines, and autoimmune disorders that destroy red blood cells. What are the signs or symptoms? Symptoms of this condition include:  Minor weakness.  Dizziness.  Headache.  Feeling heartbeats that are irregular or faster than normal (palpitations).  Shortness of breath, especially with exercise.  Paleness.  Cold sensitivity.  Indigestion.  Nausea.  Difficulty sleeping.  Difficulty concentrating. Symptoms may occur suddenly or develop slowly. If your anemia is mild, you may not have symptoms. How is this diagnosed? This condition is diagnosed based on:  Blood tests.  Your medical history.  A physical exam.  Bone marrow biopsy. Your health care provider may also check your stool (feces) for blood and may do additional testing to look for the cause of your bleeding. You may also have other tests, including:  Imaging tests, such as a CT scan or MRI.  Endoscopy.  Colonoscopy. How is this treated? Treatment for this condition depends on the cause. If you continue to lose a lot of blood, you may  need to be treated at a hospital. Treatment may include:  Taking supplements of iron, vitamin M08, or folic acid.  Taking a hormone medicine (erythropoietin) that can help to stimulate red blood cell growth.  Having a blood transfusion. This may be needed if you lose a lot of blood.  Making changes to your diet.  Having surgery to remove your spleen. Follow these instructions at home:  Take over-the-counter and prescription medicines only as told by your health care provider.  Take supplements only as told by your health care provider.  Follow any diet instructions that you were given.  Keep all follow-up visits as told by your health care provider. This is important. Contact a health care provider if:  You develop new bleeding anywhere in the body. Get help right away if:  You are very weak.  You are short of breath.  You have pain in your abdomen or chest.  You are dizzy or feel faint.  You have trouble concentrating.  You have bloody or black, tarry stools.  You vomit repeatedly or you vomit up blood. Summary  Anemia is a condition in which you do not have enough red blood cells or enough of a substance in your red blood cells that carries oxygen (hemoglobin).  Symptoms may occur suddenly or develop slowly.  If your anemia is mild, you may not have symptoms.  This condition is diagnosed with blood tests as well as a medical history and physical exam. Other tests may be needed.  Treatment for this condition depends on the cause of the anemia. This information is not intended to replace advice given to you by  your health care provider. Make sure you discuss any questions you have with your health care provider. °Document Released: 09/27/2004 Document Revised: 08/02/2017 Document Reviewed: 09/21/2016 °Elsevier Patient Education © 2020 Elsevier Inc. ° °

## 2019-03-18 NOTE — Clinical Social Work Maternal (Signed)
CLINICAL SOCIAL WORK MATERNAL/CHILD NOTE  Patient Details  Name: Sandra Banks MRN: [id number] Date of Birth: September 02, 1981  Date:  03/18/2019  Clinical Social Worker Initiating Note:  Laurey Arrow Date/Time: Initiated:  03/16/19/1000     Child's Name:  Sandra Banks   Biological Parents:  Mother, Father(Per MOB, FOB is currently incarcerated Essie Hart 11/03/1998).)   Need for Interpreter:  None   Reason for Referral:  Late or No Prenatal Care , Current Substance Use/Substance Use During Pregnancy    Address:  [address] [place] [address]    Phone number:  [phone] (home)     Additional phone number:   Household Members/Support Persons (HM/SP):   Household Member/Support Person 1, Household Member/Support Person 2, Household Member/Support Person 3, Household Member/Support Person 4, Household Member/Support Person 5, Household Member/Support Person 6   HM/SP Name Relationship DOB or Age  HM/SP -1 Dua Mehler daughter 07/09/04  HM/SP -2 Carolyna Yerian son 03/05/06  HM/SP -3 Constance Whittle daughter 03/29/07  HM/SP -4 Dicky Doe daughter 07/19/2010  HM/SP -5 Kaitlyn Bulluck daughter 02/24/2013  HM/SP -6 Irven [place] son 08/16/15  HM/SP -7        HM/SP -8          Natural Supports (not living in the home):  Parent, Spouse/significant other, Immediate Family, Extended Family   Professional Supports: Therapist   Employment: Unemployed   Type of Work:     Education:  [place] arranged:    Museum/gallery curator Resources:  [employer]   Other Resources:  Theatre stage manager Considerations Which May Impact Care: None Reported  Strengths:  Ability to meet basic needs , Engineer, materials, Home prepared for child    Psychotropic Medications:         Pediatrician:       Pediatrician List:   [facility] ([facility]  [place]       Pediatrician Fax Number:    Risk Factors/Current Problems:      Cognitive State:  Able to Concentrate , Linear Thinking , Insightful , Alert    Mood/Affect:  Interested , Calm , Apprehensive , Relaxed , Comfortable    CSW Assessment: CSW met with MOB at infant's bedside in room 314 to complete an assessment for late [facility] and SA hx. When CSW arrived MOB was bonding with infant as evidence by engaging in skin to skin; MOB and infant appeared comfortable. CSW explained CSW's role and MOB gave CSW permission to complete the assessment while MOB was bonding with infant.  CSW encouraged MOB to stop CSW at anytime if any questions arise; MOB agreed.  MOB was polite, forthcoming, and easy to engage.    CSW asked about  MOB's birthing experience and MOB reported, "It was scary but now that I know she is ok, I feel better."  CSW validated and normalized MOB's thoughts and feelings. MOB expressed having a good understanding of infant's health.  CSW also reviewed NICU visitation policy; MOB denied having any questions or concerns. CSW asked about barriers to visitation and follow-up appointments and MOB denied all barriers.   CSW asked about MOB's late [place] and SA hx.  MOB reported that MOB was unaware that she was pregnant and when pregnancy was confirmed MOB did attend regular prenatal appointments. CSW asked about MOB's SA hx and MOB acknowleged the use of Opioids (  percocet's) almost daily to help manage MOB's pain.  MOB also reported that MOB do not have an active Rx for percocet's however managed to obtain them as needed. CSW informed MOB that MOB's UDS was positive for opioids and cocaine.  MOB was adamant that she did not utilize cocaine. MOB stated, "I don't use cocaine, however, my source of income requires me to touch cocaine."  CSW explained hospital's SA policy and MOB was understanding.  CSW made MOB aware that a report will be made to [facility] CPS due to MOB's positive UDS; MOB was  understanding and stated, "My house is clean and I have everything I need for my children."   CSW will continue to offer resources and supports to family while infant remains in NICU.   CSW Plan/Description:  Perinatal Mood and Anxiety Disorder (PMADs) Education, Sudden Infant Death Syndrome (SIDS) Education, Psychosocial Support and Ongoing Assessment of Needs, Neonatal Abstinence Syndrome (NAS) Education, [place], Other Information/Referral to [employer], Child Copy Report , CSW Awaiting CPS Disposition Plan, CSW Will Continue to Monitor Umbilical Cord Tissue Drug Screen Results and Make Report if Warranted   Laurey Arrow, MSW, LCSW Clinical Social Work [phone]   Dimple Nanas, LCSW 03/18/2019, 9:55 AM

## 2019-03-19 ENCOUNTER — Telehealth: Payer: Medicaid Other

## 2019-03-20 ENCOUNTER — Ambulatory Visit: Payer: Medicaid Other

## 2019-03-20 ENCOUNTER — Telehealth: Payer: Medicaid Other | Admitting: Family

## 2019-03-23 ENCOUNTER — Telehealth: Payer: Self-pay | Admitting: General Practice

## 2019-03-23 ENCOUNTER — Ambulatory Visit: Payer: Medicaid Other

## 2019-03-23 NOTE — Telephone Encounter (Signed)
Patient was scheduled at 11:10am today for BP check.  Called patient to start visit via Telehealth;no answer.  Left message on VM for pt to give our office a call ASAP and speak with our RN.

## 2019-04-01 ENCOUNTER — Other Ambulatory Visit: Payer: Self-pay

## 2019-04-01 ENCOUNTER — Telehealth: Payer: Medicaid Other | Admitting: Clinical

## 2019-04-01 DIAGNOSIS — Z658 Other specified problems related to psychosocial circumstances: Secondary | ICD-10-CM

## 2019-04-01 NOTE — BH Specialist Note (Signed)
Integrated Behavioral Health via Telemedicine Video Visit  04/01/2019 Sandra Banks [id number]   Number of Shepherd visits: 1 Session Start time: 2:01  Session End time: 2:15 Total time: 15 minutes  Referring Provider: Gerri Lins, DO Type of Visit: Video Patient/Family location: Home [facility] Provider location: [facility] All persons participating in visit: Patient Sandra Banks and Sandra Banks  Confirmed patient's address: Yes  Confirmed patient's phone number: Yes  Any changes to demographics: No   Confirmed patient's insurance: Yes  Any changes to patient's insurance: No   Discussed confidentiality: Yes   I connected with Sandra Banks by a video enabled telemedicine application and verified that I am speaking with the correct person using two identifiers.     I discussed the limitations of evaluation and management by telemedicine and the availability of in person appointments.  I discussed that the purpose of this visit is to provide behavioral health care while limiting exposure to the novel coronavirus.   Discussed there is a possibility of technology failure and discussed alternative modes of communication if that failure occurs.  I discussed that engaging in this video visit, they consent to the provision of behavioral healthcare and the services will be billed under their insurance.  Patient and/or legal guardian expressed understanding and consented to video visit: Yes   PRESENTING CONCERNS: Patient and/or family reports the following symptoms/concerns: Pt states that since CPS case was resolved ("misunderstanding"),  swelling has gone down in her legs, and iron level went up to "7 from about 4", she feels she is adjusting well, has regained her appetite, is craving ice, and stopped taking iron.  Pt feels well supported by her family daily.  Duration of problem: Postpartum; Severity of problem: mild  STRENGTHS (Protective  Factors/Coping Skills): Strong social support; positive outlook  GOALS ADDRESSED: Patient will: 1.  Increase knowledge and/or ability of: healthy habits  2.  Demonstrate ability to:  Improve medication compliance  INTERVENTIONS: Interventions utilized:  Psychoeducation and/or Health Education and Link to [employer] Standardized Assessments completed: Not Needed  ASSESSMENT: Patient currently experiencing Psychosocial stress.   Patient may benefit from psychoeducation and brief therapeutic interventions regarding maintaining reduction of current life stress. [name]  PLAN: 1. Follow up with behavioral health clinician on : As needed 2. Behavioral recommendations:  -Begin taking prenatal vitamin (with iron in it) daily until postpartum visit -Consider pairing up iron-rich foods with high vitamin-c foods daily, to help increase iron before postpartum visit  3. Referral(s): Integrated Orthoptist (In Clinic) and [employer] Resources:  New mom support  I discussed the assessment and treatment plan with the patient and/or parent/guardian. They were provided an opportunity to ask questions and all were answered. They agreed with the plan and demonstrated an understanding of the instructions.   They were advised to call back or seek an in-person evaluation if the symptoms worsen or if the condition fails to improve as anticipated.   Sandra Banks 

## 2019-04-01 NOTE — Progress Notes (Signed)
error 

## 2019-04-02 ENCOUNTER — Ambulatory Visit: Payer: Medicaid Other

## 2019-04-29 ENCOUNTER — Telehealth: Payer: Medicaid Other | Admitting: Obstetrics and Gynecology

## 2019-04-29 ENCOUNTER — Telehealth: Payer: Self-pay | Admitting: General Practice

## 2019-04-29 NOTE — Telephone Encounter (Signed)
Called pt to check in for Mychart appointment.  Asked patient to check BP and their was a long pause.  Phone had disconnected.  Attempted to call pt back and pt will not answer.

## 2019-05-09 IMAGING — US US OB TRANSVAGINAL
1 series · 15 of 24 positions shown · non-contrast
Comparison: None.

CLINICAL DATA: Pregnant patient with bleeding

EXAM:
TRANSVAGINAL OB ULTRASOUND
TECHNIQUE: Transvaginal ultrasound was performed for complete evaluation of the
gestation as well as the maternal uterus, adnexal regions, and
pelvic cul-de-sac.

[Series 1: us ob transvaginal · 24 acquisitions, 15 frames shown]
[im 1/24]
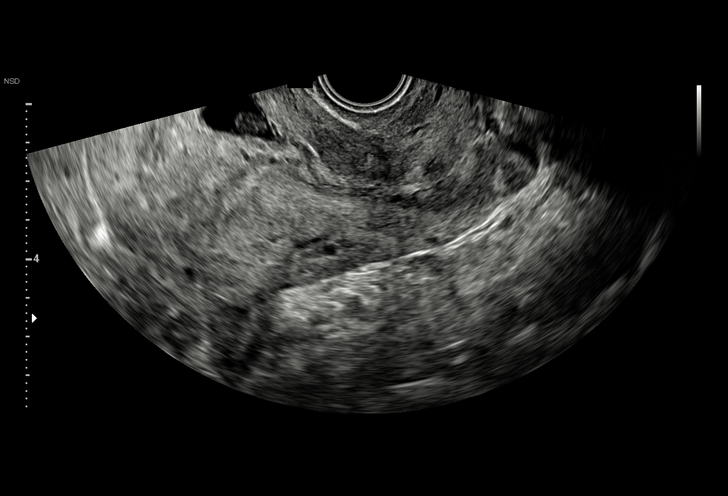
[im 3/24]
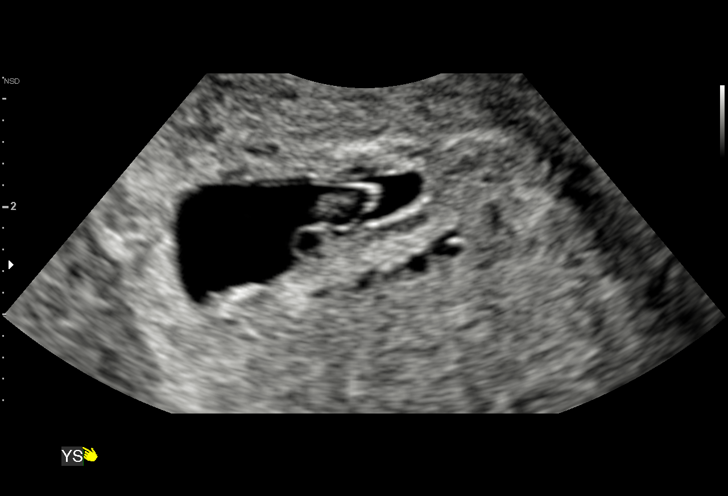
[im 5/24]
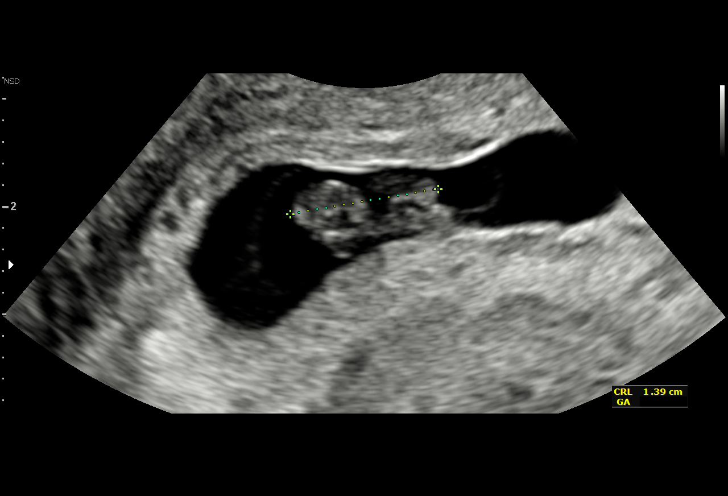
[im 6/24]
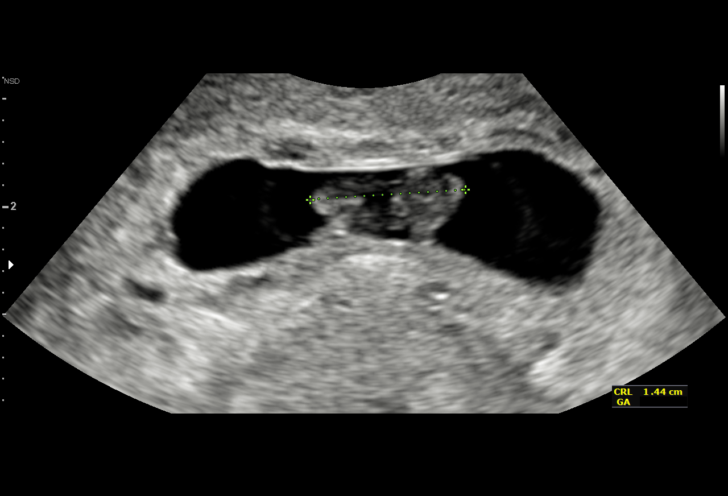
[im 8/24]
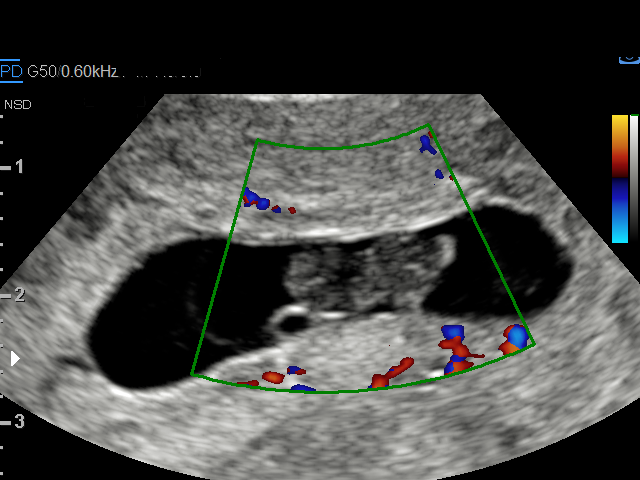
[im 9/24]
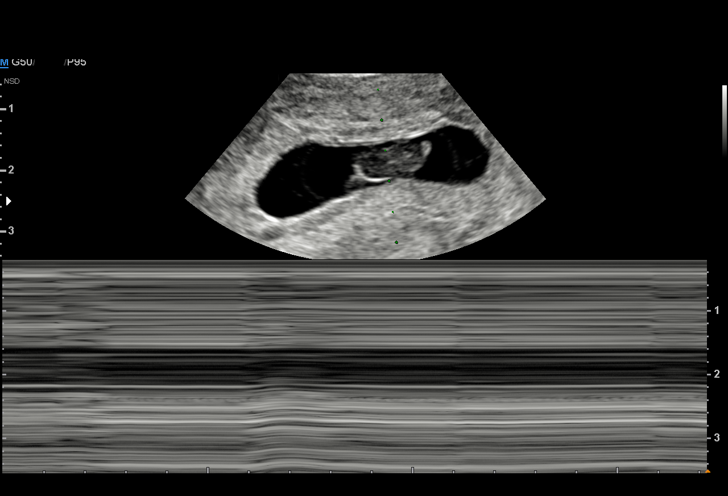
[im 11/24]
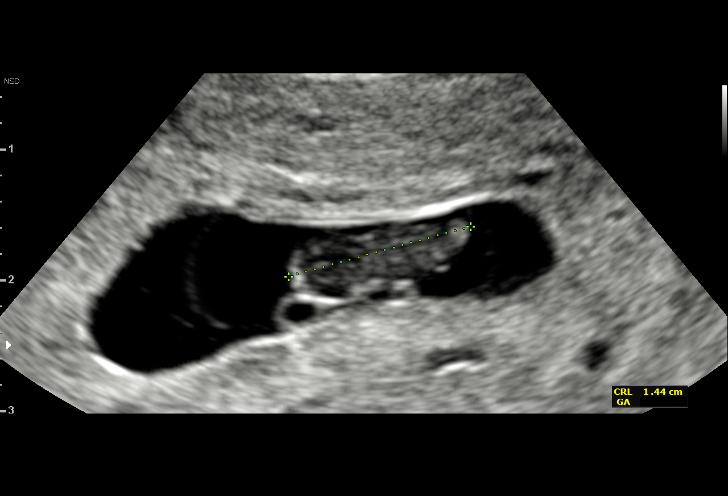
[im 13/24]
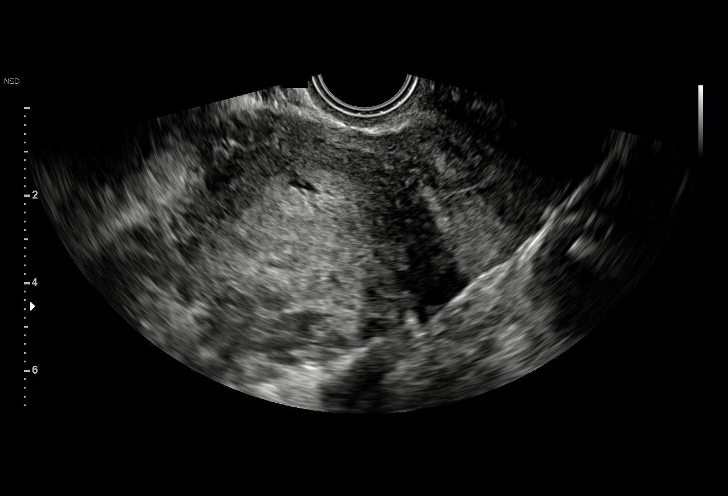
[im 14/24]
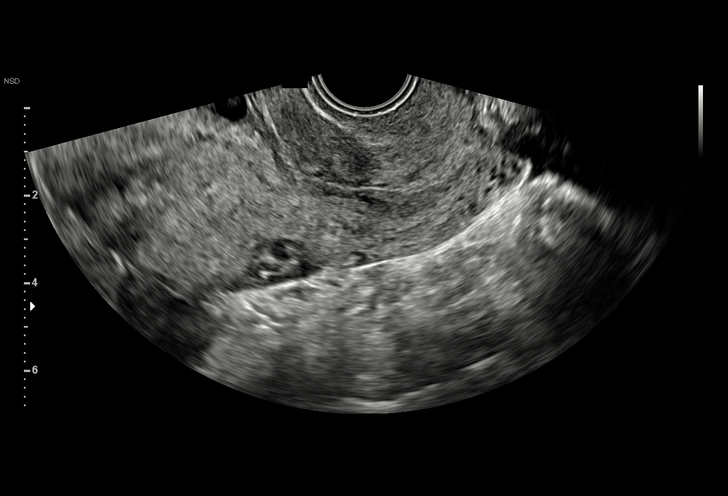
[im 16/24]
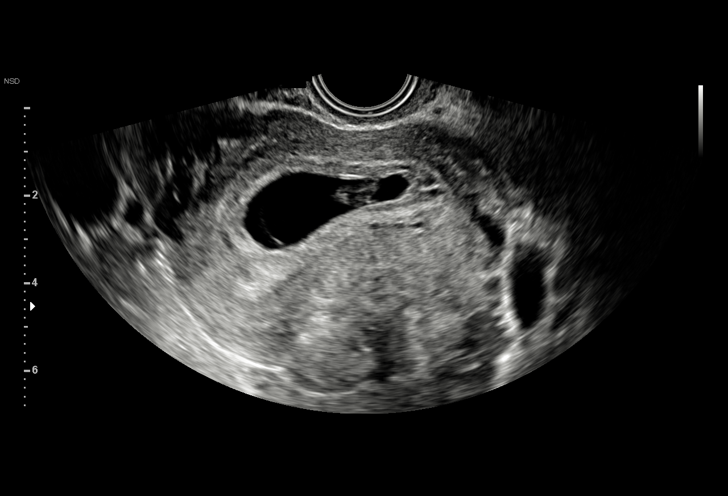
[im 17/24]
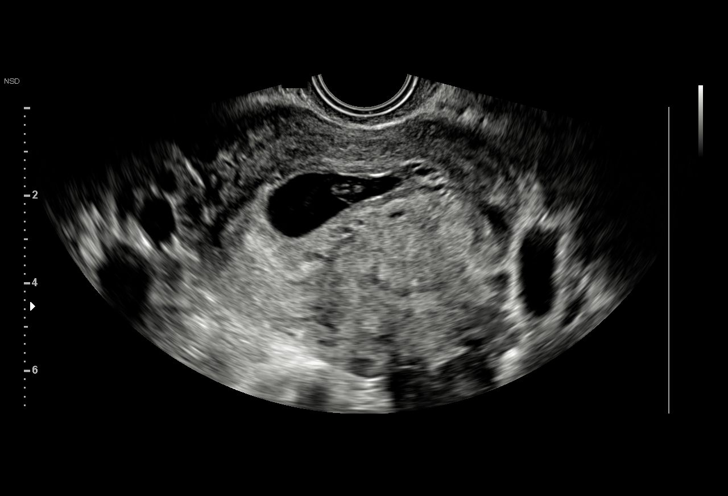
[im 19/24]
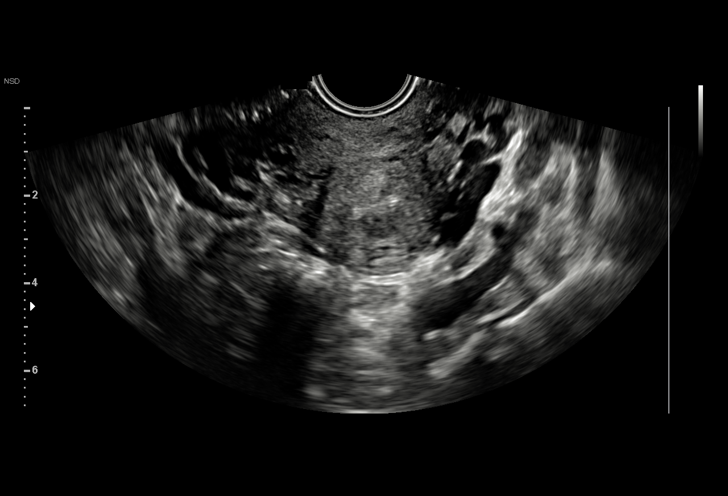
[im 21/24]
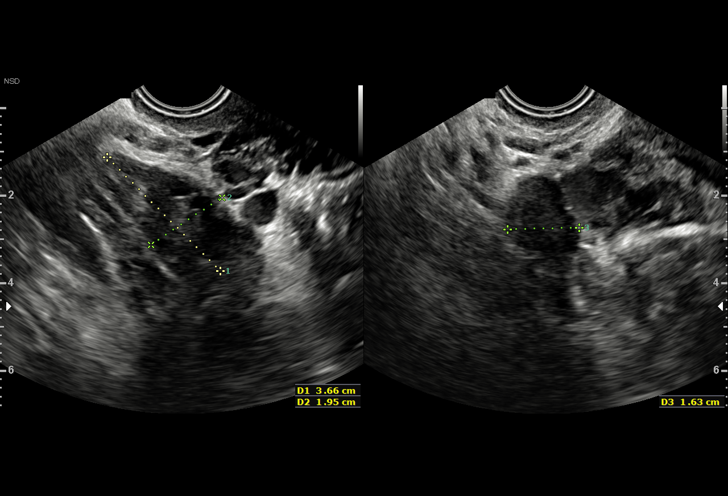
[im 22/24]
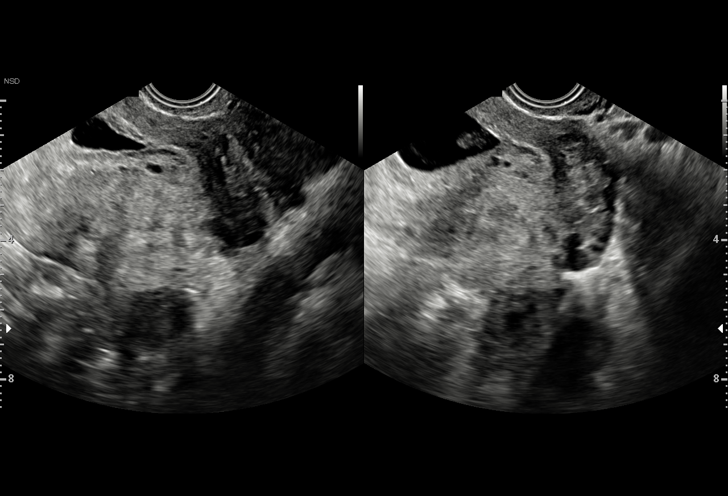
[im 24/24]
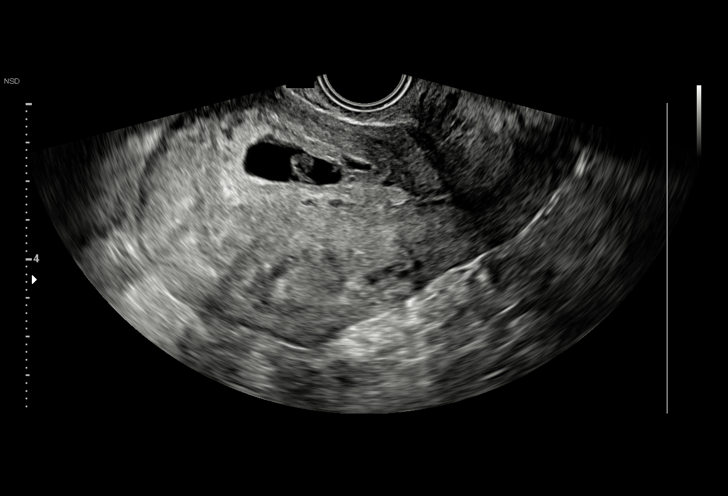

[15 of 24 positions shown; findings below may reference images not displayed]

FINDINGS: Intrauterine gestational sac: Single

Yolk sac:  Visualized.

Embryo:  Visualized.

Cardiac Activity: Not Visualized.

MSD:   mm    w     d

CRL:   1.42 cm fetal pole.  7 w 5 d                  US EDC:

Subchorionic hemorrhage:  None visualized.

Maternal uterus/adnexae: Normal in appearance.
IMPRESSION: 1. An intrauterine pregnancy is identified. No fetal heart tones are
seen in the 14 mm fetus. The findings are consistent with fetal
demise.

Findings meet definitive criteria for failed pregnancy. This follows
SRU consensus guidelines: Diagnostic Criteria for Nonviable
Pregnancy Early in the First Trimester. N Engl J Med

## 2019-06-01 ENCOUNTER — Encounter (HOSPITAL_COMMUNITY): Payer: Self-pay | Admitting: *Deleted

## 2019-06-04 ENCOUNTER — Encounter (HOSPITAL_COMMUNITY): Payer: Self-pay | Admitting: *Deleted

## 2019-09-25 IMAGING — US US OB TRANSVAGINAL
1 series · 15 of 28 positions shown · non-contrast
Comparison: 05/19/2017.

CLINICAL DATA: Abdominal pain. Eight weeks and 1 day pregnant by
last menstrual period.

EXAM:
OBSTETRIC <14 WK US AND TRANSVAGINAL OB US
TECHNIQUE: Both transabdominal and transvaginal ultrasound examinations were
performed for complete evaluation of the gestation as well as the
maternal uterus, adnexal regions, and pelvic cul-de-sac.
Transvaginal technique was performed to assess early pregnancy.

[Series 1: us ob transvaginal · 15 of 33 slices shown]
[im 1/33]
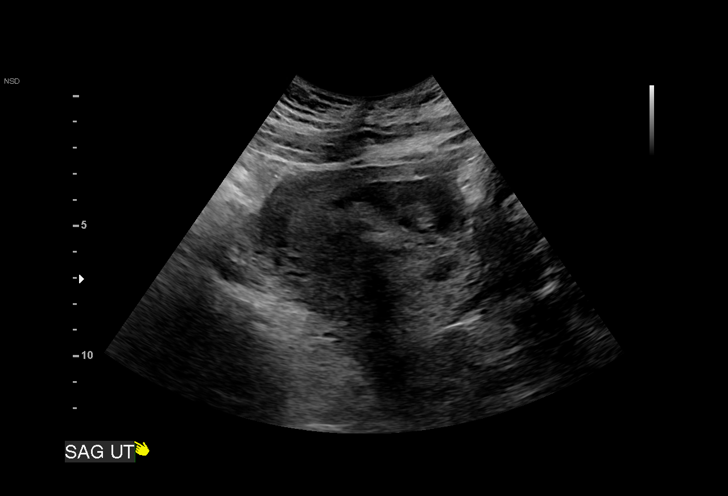
[im 3/33]
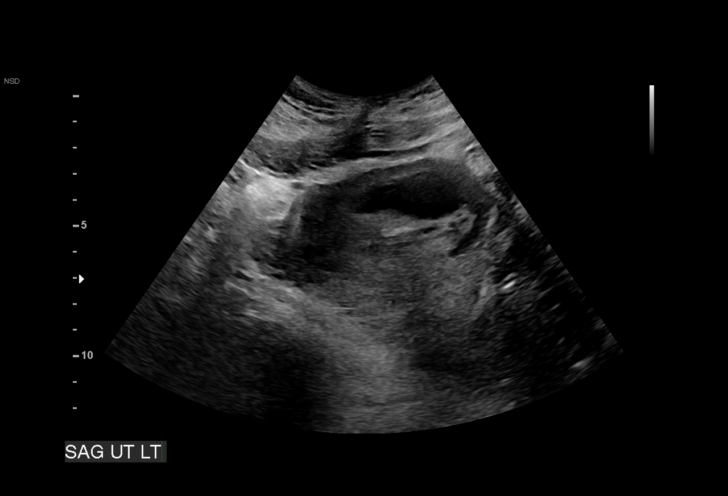
[im 5/33]
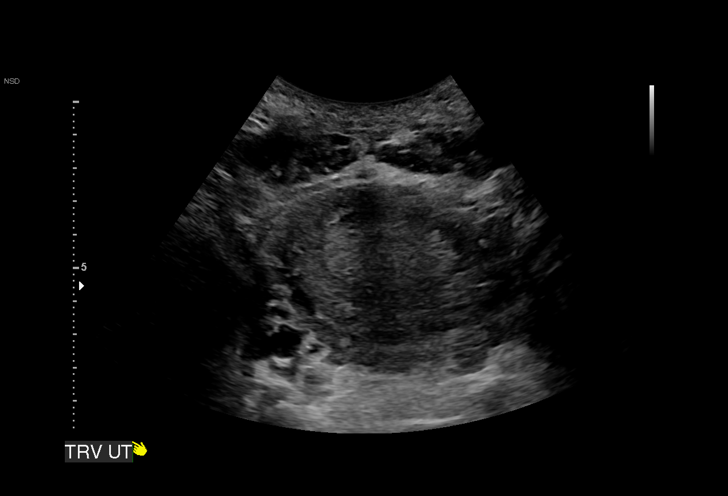
[im 8/33]
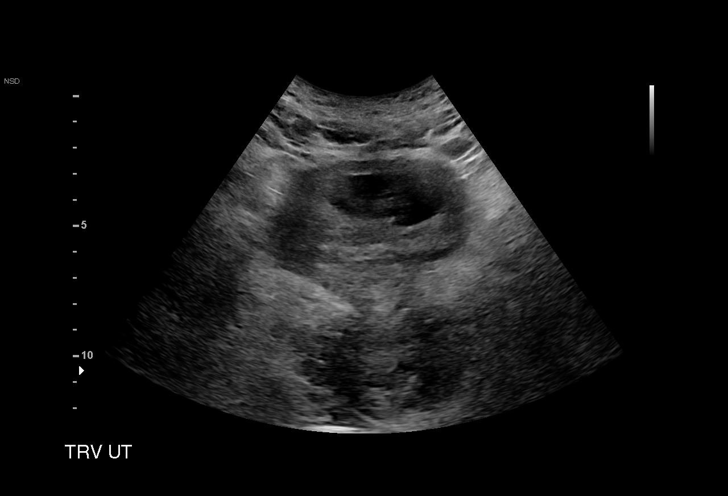
[im 10/33]
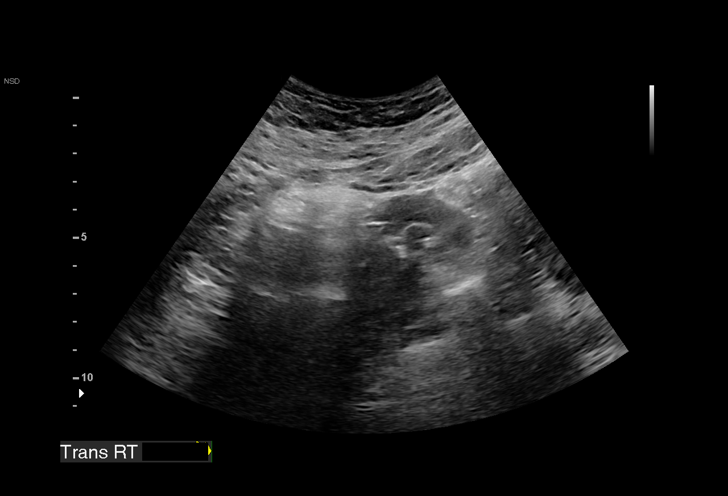
[im 12/33]
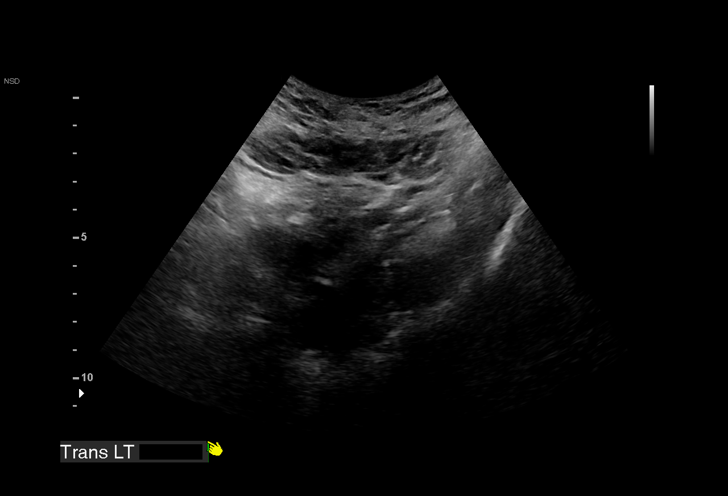
[im 15/33]
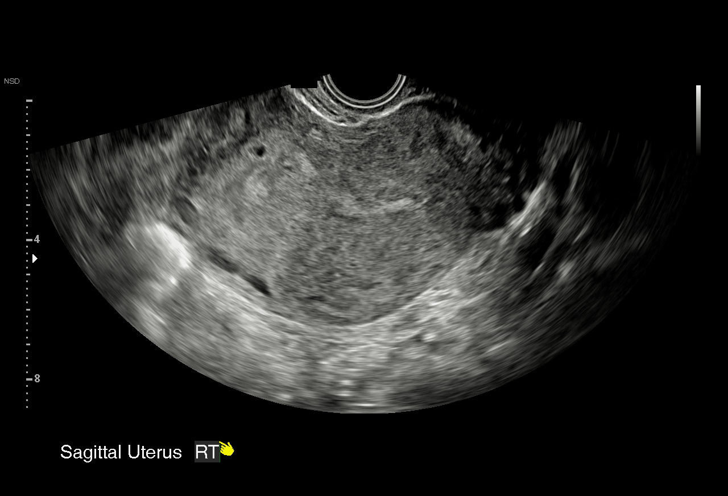
[im 17/33]
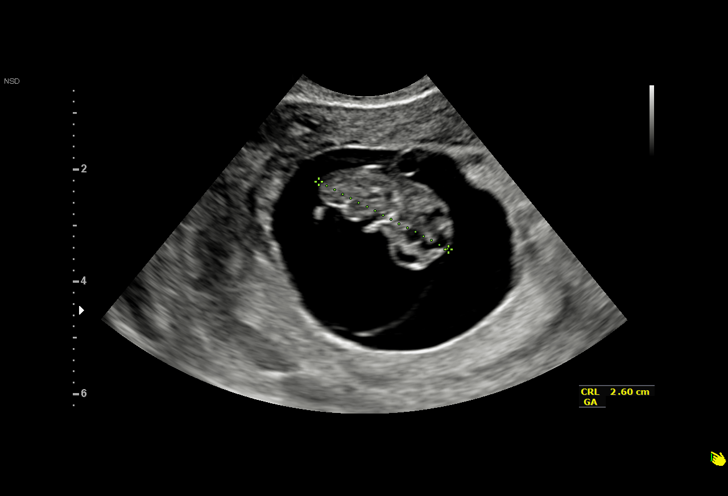
[im 18/33]
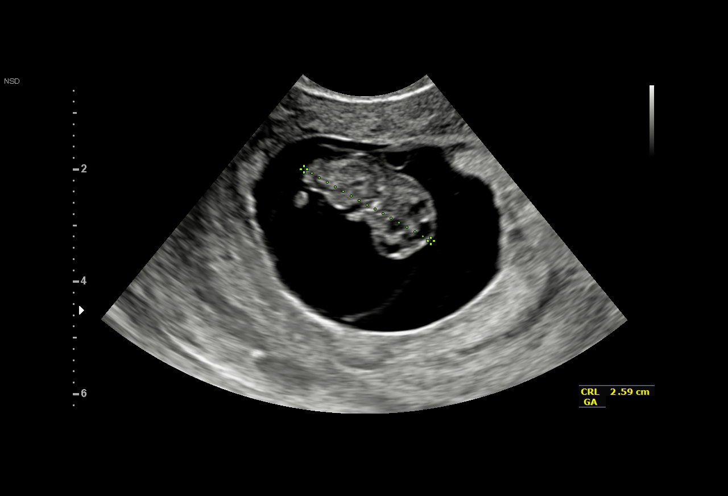
[im 21/33]
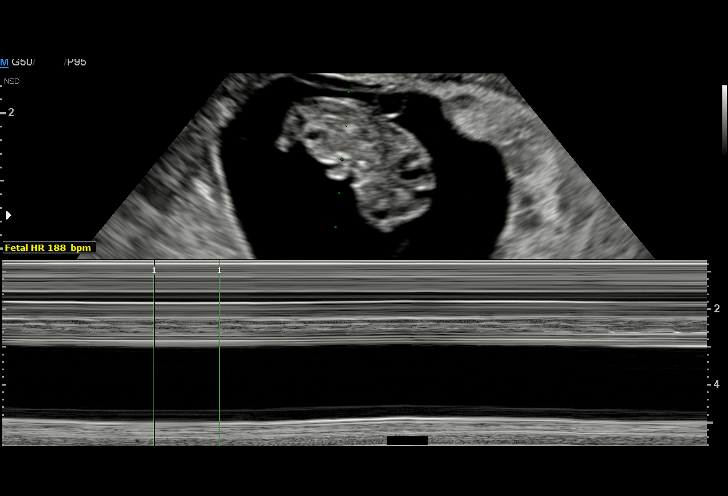
[im 23/33]
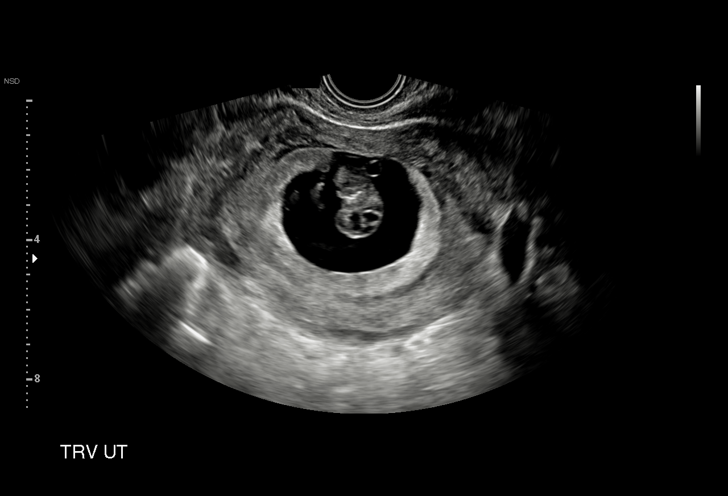
[im 25/33]
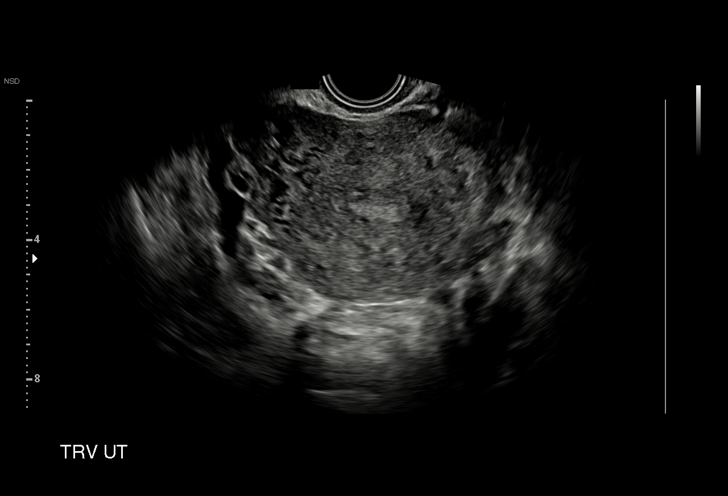
[im 28/33]
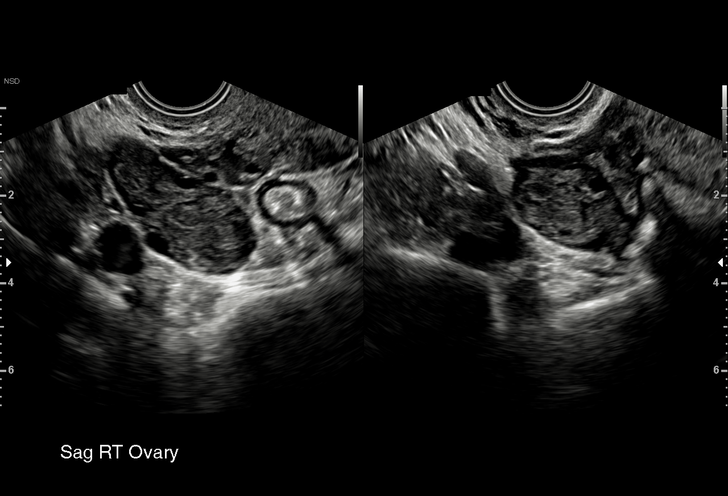
[im 30/33]
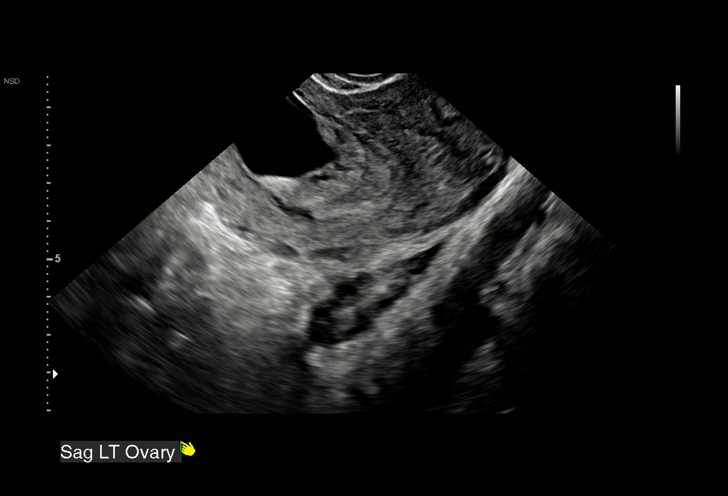
[im 33/33]
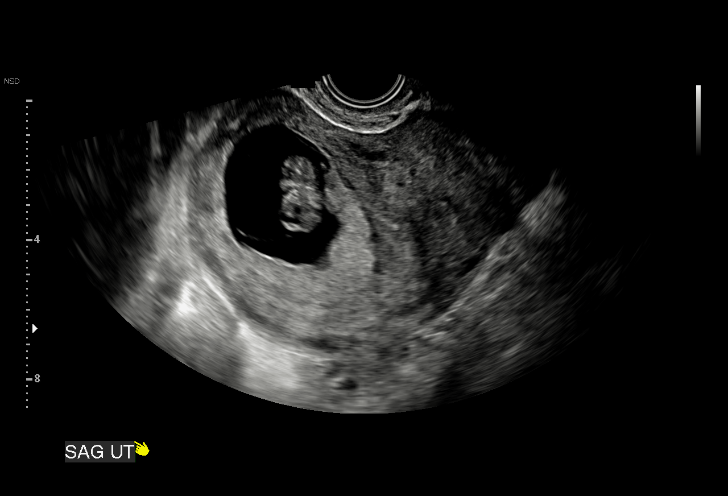

[15 of 28 positions shown; findings below may reference images not displayed]

FINDINGS: Intrauterine gestational sac: Visualized

Yolk sac:  Visualized

Embryo:  Visualized

Cardiac Activity: Visualized

Heart Rate: 188 bpm

CRL:  26.0 mm   9 w   2 d                  US EDC: 05/08/2018

Subchorionic hemorrhage:  None visualized.

Maternal uterus/adnexae: Normal appearing maternal ovaries. Trace
amount of free peritoneal fluid, within normal limits of
physiological fluid.
IMPRESSION: Single live intrauterine gestation with an estimated gestational age
of 9 weeks and 2 days. No complicating features.

## 2020-03-03 DIAGNOSIS — Z419 Encounter for procedure for purposes other than remedying health state, unspecified: Secondary | ICD-10-CM | POA: Diagnosis not present

## 2020-04-03 DIAGNOSIS — Z419 Encounter for procedure for purposes other than remedying health state, unspecified: Secondary | ICD-10-CM | POA: Diagnosis not present

## 2020-05-04 DIAGNOSIS — Z419 Encounter for procedure for purposes other than remedying health state, unspecified: Secondary | ICD-10-CM | POA: Diagnosis not present

## 2020-06-03 DIAGNOSIS — Z419 Encounter for procedure for purposes other than remedying health state, unspecified: Secondary | ICD-10-CM | POA: Diagnosis not present

## 2020-07-04 DIAGNOSIS — Z419 Encounter for procedure for purposes other than remedying health state, unspecified: Secondary | ICD-10-CM | POA: Diagnosis not present

## 2020-08-03 DIAGNOSIS — Z419 Encounter for procedure for purposes other than remedying health state, unspecified: Secondary | ICD-10-CM | POA: Diagnosis not present

## 2020-09-03 DIAGNOSIS — Z419 Encounter for procedure for purposes other than remedying health state, unspecified: Secondary | ICD-10-CM | POA: Diagnosis not present

## 2020-10-04 DIAGNOSIS — Z419 Encounter for procedure for purposes other than remedying health state, unspecified: Secondary | ICD-10-CM | POA: Diagnosis not present

## 2020-11-01 DIAGNOSIS — Z419 Encounter for procedure for purposes other than remedying health state, unspecified: Secondary | ICD-10-CM | POA: Diagnosis not present

## 2020-12-02 DIAGNOSIS — Z419 Encounter for procedure for purposes other than remedying health state, unspecified: Secondary | ICD-10-CM | POA: Diagnosis not present

## 2021-01-01 DIAGNOSIS — Z419 Encounter for procedure for purposes other than remedying health state, unspecified: Secondary | ICD-10-CM | POA: Diagnosis not present

## 2021-02-01 DIAGNOSIS — Z419 Encounter for procedure for purposes other than remedying health state, unspecified: Secondary | ICD-10-CM | POA: Diagnosis not present

## 2021-03-03 DIAGNOSIS — Z419 Encounter for procedure for purposes other than remedying health state, unspecified: Secondary | ICD-10-CM | POA: Diagnosis not present

## 2021-04-03 DIAGNOSIS — Z419 Encounter for procedure for purposes other than remedying health state, unspecified: Secondary | ICD-10-CM | POA: Diagnosis not present

## 2021-05-04 DIAGNOSIS — Z419 Encounter for procedure for purposes other than remedying health state, unspecified: Secondary | ICD-10-CM | POA: Diagnosis not present

## 2021-06-03 DIAGNOSIS — Z419 Encounter for procedure for purposes other than remedying health state, unspecified: Secondary | ICD-10-CM | POA: Diagnosis not present

## 2021-07-04 DIAGNOSIS — Z419 Encounter for procedure for purposes other than remedying health state, unspecified: Secondary | ICD-10-CM | POA: Diagnosis not present

## 2021-08-03 DIAGNOSIS — Z419 Encounter for procedure for purposes other than remedying health state, unspecified: Secondary | ICD-10-CM | POA: Diagnosis not present

## 2021-09-03 DIAGNOSIS — Z419 Encounter for procedure for purposes other than remedying health state, unspecified: Secondary | ICD-10-CM | POA: Diagnosis not present

## 2021-10-03 NOTE — Addendum Note (Signed)
Encounter addended by: Horton Finer, RN on: 10/03/2021 4:04 PM  Actions taken: Letter saved

## 2021-10-04 DIAGNOSIS — Z419 Encounter for procedure for purposes other than remedying health state, unspecified: Secondary | ICD-10-CM | POA: Diagnosis not present

## 2021-11-01 DIAGNOSIS — Z419 Encounter for procedure for purposes other than remedying health state, unspecified: Secondary | ICD-10-CM | POA: Diagnosis not present

## 2021-12-02 DIAGNOSIS — Z419 Encounter for procedure for purposes other than remedying health state, unspecified: Secondary | ICD-10-CM | POA: Diagnosis not present

## 2022-01-01 DIAGNOSIS — Z419 Encounter for procedure for purposes other than remedying health state, unspecified: Secondary | ICD-10-CM | POA: Diagnosis not present

## 2022-02-01 DIAGNOSIS — Z419 Encounter for procedure for purposes other than remedying health state, unspecified: Secondary | ICD-10-CM | POA: Diagnosis not present

## 2022-03-03 DIAGNOSIS — Z419 Encounter for procedure for purposes other than remedying health state, unspecified: Secondary | ICD-10-CM | POA: Diagnosis not present

## 2022-04-03 DIAGNOSIS — Z419 Encounter for procedure for purposes other than remedying health state, unspecified: Secondary | ICD-10-CM | POA: Diagnosis not present

## 2022-05-04 DIAGNOSIS — Z419 Encounter for procedure for purposes other than remedying health state, unspecified: Secondary | ICD-10-CM | POA: Diagnosis not present

## 2022-06-03 DIAGNOSIS — Z419 Encounter for procedure for purposes other than remedying health state, unspecified: Secondary | ICD-10-CM | POA: Diagnosis not present

## 2022-07-04 DIAGNOSIS — Z419 Encounter for procedure for purposes other than remedying health state, unspecified: Secondary | ICD-10-CM | POA: Diagnosis not present

## 2022-08-03 DIAGNOSIS — Z419 Encounter for procedure for purposes other than remedying health state, unspecified: Secondary | ICD-10-CM | POA: Diagnosis not present

## 2022-09-03 DIAGNOSIS — Z419 Encounter for procedure for purposes other than remedying health state, unspecified: Secondary | ICD-10-CM | POA: Diagnosis not present

## 2022-10-04 DIAGNOSIS — Z419 Encounter for procedure for purposes other than remedying health state, unspecified: Secondary | ICD-10-CM | POA: Diagnosis not present

## 2022-11-02 DIAGNOSIS — Z419 Encounter for procedure for purposes other than remedying health state, unspecified: Secondary | ICD-10-CM | POA: Diagnosis not present

## 2022-12-03 DIAGNOSIS — Z419 Encounter for procedure for purposes other than remedying health state, unspecified: Secondary | ICD-10-CM | POA: Diagnosis not present

## 2023-01-02 DIAGNOSIS — Z419 Encounter for procedure for purposes other than remedying health state, unspecified: Secondary | ICD-10-CM | POA: Diagnosis not present

## 2023-02-02 DIAGNOSIS — Z419 Encounter for procedure for purposes other than remedying health state, unspecified: Secondary | ICD-10-CM | POA: Diagnosis not present

## 2023-03-04 DIAGNOSIS — Z419 Encounter for procedure for purposes other than remedying health state, unspecified: Secondary | ICD-10-CM | POA: Diagnosis not present

## 2023-04-04 DIAGNOSIS — Z419 Encounter for procedure for purposes other than remedying health state, unspecified: Secondary | ICD-10-CM | POA: Diagnosis not present

## 2023-05-05 DIAGNOSIS — Z419 Encounter for procedure for purposes other than remedying health state, unspecified: Secondary | ICD-10-CM | POA: Diagnosis not present

## 2023-06-04 DIAGNOSIS — Z419 Encounter for procedure for purposes other than remedying health state, unspecified: Secondary | ICD-10-CM | POA: Diagnosis not present

## 2023-07-05 DIAGNOSIS — Z419 Encounter for procedure for purposes other than remedying health state, unspecified: Secondary | ICD-10-CM | POA: Diagnosis not present

## 2023-08-04 DIAGNOSIS — Z419 Encounter for procedure for purposes other than remedying health state, unspecified: Secondary | ICD-10-CM | POA: Diagnosis not present

## 2023-09-04 DIAGNOSIS — Z419 Encounter for procedure for purposes other than remedying health state, unspecified: Secondary | ICD-10-CM | POA: Diagnosis not present

## 2023-10-05 DIAGNOSIS — Z419 Encounter for procedure for purposes other than remedying health state, unspecified: Secondary | ICD-10-CM | POA: Diagnosis not present

## 2023-11-02 DIAGNOSIS — Z419 Encounter for procedure for purposes other than remedying health state, unspecified: Secondary | ICD-10-CM | POA: Diagnosis not present

## 2023-12-14 DIAGNOSIS — Z419 Encounter for procedure for purposes other than remedying health state, unspecified: Secondary | ICD-10-CM | POA: Diagnosis not present

## 2023-12-31 DIAGNOSIS — L732 Hidradenitis suppurativa: Secondary | ICD-10-CM | POA: Diagnosis not present

## 2023-12-31 DIAGNOSIS — L0591 Pilonidal cyst without abscess: Secondary | ICD-10-CM | POA: Diagnosis not present

## 2023-12-31 DIAGNOSIS — L0501 Pilonidal cyst with abscess: Secondary | ICD-10-CM | POA: Diagnosis not present

## 2023-12-31 DIAGNOSIS — Z6841 Body Mass Index (BMI) 40.0 and over, adult: Secondary | ICD-10-CM | POA: Diagnosis not present

## 2023-12-31 DIAGNOSIS — E669 Obesity, unspecified: Secondary | ICD-10-CM | POA: Diagnosis not present

## 2024-01-01 DIAGNOSIS — L0591 Pilonidal cyst without abscess: Secondary | ICD-10-CM | POA: Diagnosis not present

## 2024-01-01 DIAGNOSIS — L0501 Pilonidal cyst with abscess: Secondary | ICD-10-CM | POA: Diagnosis not present

## 2024-01-13 DIAGNOSIS — Z419 Encounter for procedure for purposes other than remedying health state, unspecified: Secondary | ICD-10-CM | POA: Diagnosis not present

## 2024-02-13 DIAGNOSIS — Z419 Encounter for procedure for purposes other than remedying health state, unspecified: Secondary | ICD-10-CM | POA: Diagnosis not present

## 2024-03-14 DIAGNOSIS — Z419 Encounter for procedure for purposes other than remedying health state, unspecified: Secondary | ICD-10-CM | POA: Diagnosis not present

## 2024-04-14 DIAGNOSIS — Z419 Encounter for procedure for purposes other than remedying health state, unspecified: Secondary | ICD-10-CM | POA: Diagnosis not present

## 2024-05-15 DIAGNOSIS — Z419 Encounter for procedure for purposes other than remedying health state, unspecified: Secondary | ICD-10-CM | POA: Diagnosis not present

## 2024-08-14 DIAGNOSIS — Z419 Encounter for procedure for purposes other than remedying health state, unspecified: Secondary | ICD-10-CM | POA: Diagnosis not present

## 2024-10-07 ENCOUNTER — Encounter (INDEPENDENT_AMBULATORY_CARE_PROVIDER_SITE_OTHER): Payer: Self-pay
